# Patient Record
Sex: Female | Born: 1965 | Race: Black or African American | Hispanic: No | Marital: Single | Smoking: Never smoker
Health system: Southern US, Community
[De-identification: ages and names within clinical notes are randomized; demographics above are authoritative.]

## PROBLEM LIST (undated history)

## (undated) DIAGNOSIS — M25562 Pain in left knee: Secondary | ICD-10-CM

## (undated) DIAGNOSIS — E785 Hyperlipidemia, unspecified: Secondary | ICD-10-CM

## (undated) DIAGNOSIS — M25561 Pain in right knee: Secondary | ICD-10-CM

## (undated) DIAGNOSIS — G8929 Other chronic pain: Secondary | ICD-10-CM

## (undated) DIAGNOSIS — Z1371 Encounter for nonprocreative screening for genetic disease carrier status: Secondary | ICD-10-CM

## (undated) DIAGNOSIS — Z87898 Personal history of other specified conditions: Secondary | ICD-10-CM

## (undated) DIAGNOSIS — E559 Vitamin D deficiency, unspecified: Secondary | ICD-10-CM

## (undated) HISTORY — PX: LEEP: SHX91

## (undated) HISTORY — DX: Hyperlipidemia, unspecified: E78.5

## (undated) HISTORY — DX: Vitamin D deficiency, unspecified: E55.9

## (undated) HISTORY — PX: BREAST LUMPECTOMY: SHX2

## (undated) HISTORY — DX: Pain in left knee: M25.562

## (undated) HISTORY — DX: Other chronic pain: G89.29

## (undated) HISTORY — DX: Personal history of other specified conditions: Z87.898

## (undated) HISTORY — DX: Pain in right knee: M25.561

## (undated) HISTORY — DX: Encounter for nonprocreative screening for genetic disease carrier status: Z13.71

## (undated) HISTORY — PX: OTHER SURGICAL HISTORY: SHX169

---

## 1998-01-07 HISTORY — PX: BREAST EXCISIONAL BIOPSY: SUR124

## 2006-03-25 ENCOUNTER — Ambulatory Visit: Payer: Self-pay | Admitting: Family Medicine

## 2006-03-25 ENCOUNTER — Encounter: Payer: Self-pay | Admitting: Family Medicine

## 2007-05-13 ENCOUNTER — Ambulatory Visit: Payer: Self-pay | Admitting: Obstetrics & Gynecology

## 2007-05-13 ENCOUNTER — Encounter: Payer: Self-pay | Admitting: Obstetrics & Gynecology

## 2007-06-16 ENCOUNTER — Ambulatory Visit: Payer: Self-pay | Admitting: Obstetrics & Gynecology

## 2007-07-11 ENCOUNTER — Emergency Department (HOSPITAL_COMMUNITY): Admission: EM | Admit: 2007-07-11 | Discharge: 2007-07-11 | Payer: Self-pay | Admitting: Emergency Medicine

## 2008-02-03 ENCOUNTER — Ambulatory Visit: Payer: Self-pay | Admitting: Obstetrics and Gynecology

## 2008-02-03 ENCOUNTER — Encounter: Payer: Self-pay | Admitting: Obstetrics & Gynecology

## 2008-02-03 LAB — CONVERTED CEMR LAB
HCT: 36.2 % (ref 36.0–46.0)
Hemoglobin: 11.3 g/dL — ABNORMAL LOW (ref 12.0–15.0)
MCHC: 31.2 g/dL (ref 30.0–36.0)
MCV: 77.8 fL — ABNORMAL LOW (ref 78.0–100.0)
RBC: 4.65 M/uL (ref 3.87–5.11)
RDW: 17.4 % — ABNORMAL HIGH (ref 11.5–15.5)

## 2008-02-05 ENCOUNTER — Ambulatory Visit (HOSPITAL_COMMUNITY): Admission: RE | Admit: 2008-02-05 | Discharge: 2008-02-05 | Payer: Self-pay | Admitting: Obstetrics & Gynecology

## 2008-07-14 ENCOUNTER — Ambulatory Visit: Payer: Self-pay | Admitting: Obstetrics & Gynecology

## 2008-07-14 ENCOUNTER — Encounter: Payer: Self-pay | Admitting: Obstetrics & Gynecology

## 2008-07-19 ENCOUNTER — Encounter: Admission: RE | Admit: 2008-07-19 | Discharge: 2008-07-19 | Payer: Self-pay | Admitting: Obstetrics & Gynecology

## 2009-06-17 ENCOUNTER — Emergency Department: Payer: Self-pay | Admitting: Emergency Medicine

## 2009-07-13 ENCOUNTER — Ambulatory Visit: Payer: Self-pay | Admitting: Obstetrics and Gynecology

## 2010-05-22 NOTE — Assessment & Plan Note (Signed)
NAME:  PERNELL, DIKES NO.:  [id number]   MEDICAL RECORD NO.:  [id number]          PATIENT TYPE:  POB   LOCATION:  [facility] at [facility]         FACILITY:  [facility]   PHYSICIAN:  Argentina Donovan, MD        DATE OF BIRTH:  Dec 06, 1965   DATE OF SERVICE:  07/13/2009                                  CLINIC NOTE   The patient is a 45 year old single black female gravida 1, para 1-0-0-77  with a 7 year old daughter in for her annual exam with no physical or  medical complaints.  Review of systems is relatively negative.  The  patient is on Seasonique pills, but is defining very expensive since she  does not have insurance in very short time.  We are going to switch her  to Sprintec and give her enough so that she can take them in the same 3-  month cycle that she does with her present birth control pills.  Medical  history is only significant because of a borderline blood pressure  139/84.  She is slightly obese at 207, but 5 feet 8 inches tall.  She  had a history of abnormal Pap smears in the past, but her last several  have been normal.   ALLERGIES:  She is sensitive to LATEX.   FAMILY HISTORY:  Breast cancer on her father's side.  She had a  mammogram 1 year ago and is not due for another until next year.   SOCIAL HISTORY:  She does not smoke, drink alcohol or use illicit drugs.   PHYSICAL EXAMINATION:  GENERAL:  Well-developed, well-nourished African  American female in no acute distress.  VITAL SIGNS:  Blood pressure 139/84, pulse 57 per minute, weight 207  pounds, height 5 feet 8 inches tall.  HEENT:  Normocephalic, atraumatic within normal limits.  NECK:  Supple.  Thyroid, symmetrical.  No dominant masses.  BACK:  Erect.  LUNGS:  Clear to auscultation and percussion.  HEART:  No murmur.  Normal sinus rhythm.  ABDOMEN:  Soft, flat, nontender.  No masses or organomegaly.  BREASTS:  Symmetrical.  No dominant masses.  No nipple discharge.  No  supraclavicular or  axillary nodes.  PELVIC:  Genitalia, external genitalia is normal.  BUS within normal  limits.  Vagina is clean and well rugated.  Cervix is clean and parous.  Uterus is anterior, about [redacted] weeks gestational size, slightly irregular  and the adnexa is normal.  Cul-de-sac is free.  RECTAL:  She has a small asymptomatic external hemorrhoid.  EXTREMITIES:  No edema.  No varicosities.  NEUROLOGIC:  DTRs within normal limits.   IMPRESSION:  Normal physical examination.           ______________________________  Argentina Donovan, MD     PR/MEDQ  D:  07/13/2009  T:  07/14/2009  Job:  [id number]

## 2010-05-22 NOTE — Assessment & Plan Note (Signed)
NAMEMakinzy, [name]                ACCOUNT NO.:  [id number]   MEDICAL RECORD NO.:  [id number]          PATIENT TYPE:  POB   LOCATION:  [facility] at [facility]         FACILITY:  [facility]   PHYSICIAN:  Allie Bossier, MD        DATE OF BIRTH:  1965/10/02   DATE OF SERVICE:  07/14/2008                                  CLINIC NOTE   [name] is a 45 year old, single black, gravida 1, para 1, who has  a 59 year old daughter.  She comes in here for annual exam.  She has no  GYN complaints.  She wishes to restart her Seasonale birth control  pills.   PAST MEDICAL HISTORY:  She is overweight, borderline high cholesterol,  and fibroid uterus.  She says she had a history of an abnormal Pap  smear, but since she has been here since 2008 and 2009, she had normal  Pap smears.   ALLERGIES:  No known drug allergies.  She does say that she has itching  with LATEX glove use.   REVIEW OF SYSTEMS:  She is currently abstinent and has lost her job.  Mammogram is due.  Remainder of her review of systems questions are  negative.   FAMILY HISTORY:  He has a significant history for breast cancer.  Her  paternal grandmother, her paternal aunt, and 3 paternal first cousins  have breast cancer.  There is a strong family history of hypertension  and diabetes as well.  She does do her self-breast exam.   SOCIAL HISTORY:  Negative for tobacco, alcohol, or drug use.   PHYSICAL EXAMINATION:  VITAL SIGNS:  Weight 210 pounds, height 5 feet 8  inches, blood pressure 124/76, pulse 60.  HEENT:  Normal.  HEART:  Regular rate and rhythm.  LUNGS:  Clear to auscultation bilaterally.  BREASTS:  Normal bilaterally.  ABDOMEN:  Benign.  No hepatosplenomegaly.  EXTERNAL GENITALIA:  No vulvar lesions.  Cervix appears normal with  normal discharge.  Her uterus is approximately 14-week size.  It is  mobile and nontender.  Her adnexa are nontender without masses.   ASSESSMENT AND PLAN:  1. Annual exam.  I have recommended  self-breast and self-vulvar exams.      We will schedule a mammogram.  I have checked a Pap smear.  2. Menstrual regulations.  She wishes to continue the Seasonale as she      would like having short like.  Given a refill.      Allie Bossier, MD     MCD/MEDQ  D:  07/14/2008  T:  07/14/2008  Job:  [id number]

## 2010-05-22 NOTE — Assessment & Plan Note (Signed)
NAMELakresha, Hardy ANN                ACCOUNT NO.:  [id number]   MEDICAL RECORD NO.:  [id number]          PATIENT TYPE:  POB   LOCATION:  [facility] at [facility]         FACILITY:  [facility]   PHYSICIAN:  Allie Bossier, MD        DATE OF BIRTH:  [birth year]   DATE OF SERVICE:                                  CLINIC NOTE   HISTORY OF PRESENT ILLNESS:  [name] is a 45 year old single white  gravida 1, para 64 with a 44 year old daughter.  She comes here for her  annual exam.  She has no GYN complaints.  She does wish to start birth  control pills.  She has been in a monogamous relationship for the last 5  months and has used condoms for the most part, but wishes a more secure  form of birth control.  Her last period was 05/05/2007.   PAST MEDICAL HISTORY:  She is overweight.  She has a history of  borderline high cholesterol, but she has not had it checked in several  years.  She gives a history of having had an abnormal Pap smear, but we  do not know exactly what type.  No treatment was required.   PAST SURGICAL HISTORY:  She had a breast biopsy and C-section.   ALLERGIES:  NO KNOWN DRUG ALLERGIES.  SHE DOES SAY THAT SHE HAS ITCHING  WITH LATEX GLOVE USE.   REVIEW OF SYSTEMS:  She is monogamous for the last 5 months.  She works  at [employer]. Mammogram was done last in 2000, and we will have  one scheduled soon.  Otherwise, negative.   FAMILY HISTORY:  Positive for breast cancer in her paternal grandmother,  paternal aunt, paternal first cousins.  There is a strong family history  of hypertension and diabetes in her family as well.  She does do self-  breast exam.   SOCIAL HISTORY:  Denies tobacco, alcohol or drug use.   PHYSICAL EXAMINATION:  VITAL SIGNS:  Weight 206 (please note this is a  20 pound decrease since last year), blood pressure 114/62, pulse 71.  HEENT:  Normal.  HEART:  Regular rate and rhythm.  BREASTS:  Normal bilaterally.  LUNGS:  Clear to auscultation  bilaterally.  ABDOMEN:  No hepatosplenomegaly appreciated.  No other abnormalities.  PELVIC:  External genitalia, no lesions.  Her cervix is displaced to her  right, appears normal.  No lesions with normal discharge.  Bimanual exam  reveals a mobile anteverted uterus with approximately a 4 cm fibroid on  the left of the fundus.  The adnexa are without masses and are without  tenderness.   ASSESSMENT/PLAN:  1. Annual exam.  Recommended continue self-breast exams.  Discussed      self vulvar exams monthly.  Recommended a multivitamin as well.  2. Birth control.  We will start Seasonique.  I have given her a 3-      month pack and a prescription.  She will come back for fasting      lipids and blood pressure check along with fasting sugar in the      near future.  Will schedule a mammogram.  She does say that in the      future she wishes to be married and have another child.  We have      discussed decreasing fertility as age goes on, and she understands      this.      Allie Bossier, MD     MCD/MEDQ  D:  05/13/2007  T:  05/13/2007  Job:  [id number]

## 2010-05-25 NOTE — Assessment & Plan Note (Signed)
NAMEQuanesha, [name]                ACCOUNT NO.:  [id number]   MEDICAL RECORD NO.:  [id number]          PATIENT TYPE:  POB   LOCATION:  [facility] at [facility]         FACILITY:  [facility]   PHYSICIAN:  Tinnie Gens, MD        DATE OF BIRTH:  [birth year]   DATE OF SERVICE:                                  CLINIC NOTE   CHIEF COMPLAINT:  Yearly exam.   HISTORY OF PRESENT ILLNESS:  The patient is a 45 year old gravida 1,  para 1, who comes in today for yearly exam.  Her last Pap, she reports,  some __________ were abnormal.  She was supposed to return for  colposcopy at __________ but she never did.  She is otherwise without  complaints.   PAST MEDICAL HISTORY:  Significant for once having borderline elevated  cholesterol which has been normal since then.   PAST SURGICAL HISTORY:  She has had a C-section and removal of a breast  mass.   MEDICATIONS:  None.   ALLERGIES:  None known.   OB HISTORY:  G 1, P1, one C-section from her last for post dates.   GYN HISTORY:  Menarche at age 70, cycles are regular every 28 days, last  approximately 7 days and have heavy flow but is unchanged for her.  Her  LMP is March 16, 2006.  She is on no birth control but she is not  currently sexually active.  She does have a history of an abnormal Pap  in 2004 and a breast mass that was removed that was thought to be  related to infection, related to mastitis.   FAMILY HISTORY:  Significant diabetes, hypertension, and cancer in a  paternal grandmother.   SOCIAL HISTORY:  No tobacco, alcohol or drug use.  She works as an  Airline pilot.   REVIEW OF SYSTEMS:  Fourteen point review of systems reviewed as is  positive for ringing in her ears.  She has had a very extensive workup  of this at [facility] which did not reveal the cause.  This is her only  positive review of systems.  Please see GYN history in the chart.   PHYSICAL EXAMINATION:  VITAL SIGNS:  Blood pressure is 108/60, weight is  226, pulse 54,  height 5 feet 8 inches.  GENERAL:  She is a well developed, well nourished female in no acute  distress.  HEENT:  Normocephalic, atraumatic.  Sclerae anicteric.  Dentition is in  good repair.  NECK:  Supple.  Normal thyroid.  LUNGS:  Clear bilaterally.  CARDIOVASCULAR:  Regular rate and rhythm, no murmur, gallop or rubs.  ABDOMEN:  Soft, nontender, nondistended no masses.  EXTREMITIES:  No cyanosis, clubbing, trace edema.  BREASTS:  Symmetric with everted nipples.  No masses, no supraclavicular  or axillary adenopathy.  GU:  Normal external female genitalia.  BUS normal.  Vagina is pink and  rugate.  The cervix is nulliparous without lesion.  The uterus is  presently 8 to 10 weeks size, firm and retroverted.  No adnexal mass or  tenderness.   IMPRESSION:  1. Yearly exam.  2. History of abnormal Pap.   PLAN:  1. Pap  smear today.  2. We need to schedule a mammogram for the patient in May 2008, she      will turn 41.  3. We will follow-up her Pap smear as it comes back.           ______________________________  Tinnie Gens, MD     TP/MEDQ  D:  03/25/2006  T:  03/26/2006  Job:  [id number]

## 2010-08-08 ENCOUNTER — Encounter: Payer: Self-pay | Admitting: Gynecology

## 2010-08-09 ENCOUNTER — Ambulatory Visit (INDEPENDENT_AMBULATORY_CARE_PROVIDER_SITE_OTHER): Payer: BC Managed Care – PPO | Admitting: Obstetrics & Gynecology

## 2010-08-09 ENCOUNTER — Encounter: Payer: Self-pay | Admitting: Obstetrics & Gynecology

## 2010-08-09 VITALS — BP 106/66 | HR 71 | Ht 68.0 in | Wt 228.0 lb

## 2010-08-09 DIAGNOSIS — Z1272 Encounter for screening for malignant neoplasm of vagina: Secondary | ICD-10-CM

## 2010-08-09 DIAGNOSIS — R87613 High grade squamous intraepithelial lesion on cytologic smear of cervix (HGSIL): Secondary | ICD-10-CM | POA: Insufficient documentation

## 2010-08-09 DIAGNOSIS — Z01419 Encounter for gynecological examination (general) (routine) without abnormal findings: Secondary | ICD-10-CM

## 2010-08-09 MED ORDER — LEVONORGEST-ETH ESTRAD 91-DAY 0.15-0.03 &0.01 MG PO TABS: 1.0000 | ORAL_TABLET | Freq: Every day | ORAL | Status: DC

## 2010-08-09 NOTE — Progress Notes (Signed)
  Subjective:    Mayli Covington is a 45 y.o. female here for a routine exam.  No current complaints:   Gynecologic History Patient's last menstrual period was 07/26/2010. Contraception: OCP (estrogen/progesterone) Last Pap: 07/13/09. Results were: normal Last mammogram: 07/2009. Results were: normal  Obstetric History OB History    Grav Para Term Preterm Abortions TAB SAB Ect Mult Living   1 1        1      # Outc Date GA Lbr Len/2nd Wgt Sex Del Anes PTL Lv   1 PAR     F CCS   Yes       The following portions of the patient's history were reviewed and updated as appropriate: allergies, current medications, past family history, past medical history, past social history, past surgical history and problem list.  Review of Systems A comprehensive review of systems was negative.    Objective:   Filed Vitals:   08/09/10 0836  BP: 106/66  Pulse: 71    GENERAL: Well-developed, well-nourished female in no acute distress.  HEENT: Normocephalic, atraumatic. Sclerae anicteric.  NECK: Supple. Normal thyroid.  LUNGS: Clear to auscultation bilaterally.  HEART: Regular rate and rhythm. BREASTS: Symmetric with everted nipples. No masses, skin changes, nipple drainage,   lymphadenopathy. ABDOMEN: Soft, nontender, nondistended. No organomegaly. PELVIC: Normal external female genitalia. Vagina is pink and rugated.  Normal discharge. Normal cervix contour. Uterus is normal in size. No adnexal mass or tenderness. Pap smear obtained.  EXTREMITIES: No cyanosis, clubbing, or edema, 2+ distal pulses.     Assessment:    Healthy female exam.     Plan:  Refill of Seasonique ordered  Contraception: OCP (estrogen/progesterone). Mammogram ordered. Follow up in: 1 year.    ANYANWU,UGONNA A 08/09/2010

## 2010-10-04 ENCOUNTER — Ambulatory Visit (INDEPENDENT_AMBULATORY_CARE_PROVIDER_SITE_OTHER): Payer: BC Managed Care – PPO | Admitting: Obstetrics & Gynecology

## 2010-10-04 ENCOUNTER — Encounter: Payer: Self-pay | Admitting: Obstetrics & Gynecology

## 2010-10-04 VITALS — BP 137/83 | HR 65 | Ht 68.0 in | Wt 229.0 lb

## 2010-10-04 DIAGNOSIS — R87613 High grade squamous intraepithelial lesion on cytologic smear of cervix (HGSIL): Secondary | ICD-10-CM

## 2010-10-04 DIAGNOSIS — IMO0002 Reserved for concepts with insufficient information to code with codable children: Secondary | ICD-10-CM

## 2010-10-04 DIAGNOSIS — Z1239 Encounter for other screening for malignant neoplasm of breast: Secondary | ICD-10-CM

## 2010-10-04 DIAGNOSIS — Z1231 Encounter for screening mammogram for malignant neoplasm of breast: Secondary | ICD-10-CM

## 2010-10-04 NOTE — Patient Instructions (Signed)
Care After Colposcopy Colposcopy is a procedure in which a special tool is used to magnify the surface of the cervix. A tissue sample (biopsy) may also be taken. This sample will be used to detect cancer of the cervix or other problems. These procedures may be done after a pelvic exam shows a possible problem.  AFTER THE TEST  You may have some cramping.   Lie down for a few minutes if you feel light headed.   You may have some bleeding which should stop in a few days.  HOME CARE  Do not have sex or use tampons for 2-3 days or as directed.   Only take medicine as directed by your doctor.   If you are on birth control pills, continue to take your pills the way you normally do.   To protect your privacy, test results cannot be given over the phone. Make sure you get the results of your test. Ask how you will get the results if you have not been told. Do not assume everything is normal if you have not heard from your doctor. Follow up on all of your test results.  GET HELP RIGHT AWAY IF YOU:  Are bleeding heavily or passing blood clots.   Develop a fever of 102F (38.9C) or higher.   Have abnormal vaginal discharge.   Have cramps that do not go away after medicine.   Feel light headed, dizzy or faint.  MAKE SURE YOU:   Understand these instructions.   Will watch your condition.   Will get help right away if you are not doing well or get worse.  Document Released: 06/12/2007 Document Re-Released: 10/21/2008 [facility] Patient Information 2011 [place], [place].    Cervical Dysplasia Cervical dysplasia is a condition in which a woman has abnormal changes in the cells of her cervix. The cervix is the opening to the uterus (womb) between the vagina and the uterus. These changes are called cervical dysplasia and may be the first signs of cervical cancer. These cells can be taken from the cervix during a Pap smear and then looked at under a microscope. With early detection, treatment,  and close follow-up care, nearly all cervical dysplasia can be cured. If untreated, the mild to moderate stages of dysplasia often grow more severe.  CAUSES Risk factors for cervical dysplasia.  Having had a sexually transmitted disease, including:   Chlamydia.   Human papilloma virus (HPV).  The following increase the risk for cervical dysplasia.  Becoming sexually active before age 47.   Having had more than 1 sexual partner.   Not using protection, such as condoms, during sexual intercourse, especially with new sexual partners.   Having had cancer of the vagina or vulva.   Having a sexual partner whose previous partner had cancer of the cervix or cervical dysplasia.   Having a sexual partner who has or has had cancer of the penis.   Having a weakened immune system (HIV, organ transplant).   Being the daughter of a woman who took DES (diethylstilbestrol) during pregnancy.   A history of cervical cancer in a woman's sister or mother.   Smoking.   Having had an abnormal pap test in the past.  SYMPTOMS  There are usually no symptoms. If there are symptoms, they may be vague such as:  Abnormal vaginal discharge.   Bleeding between periods or following intercourse.   Bleeding during menopause.   Pain on intercourse (dyspareunia).  DIAGNOSIS  The Pap test is the best  way of detecting abnormalities of the cervix.   Biopsy (removing a piece of tissue to look at under the microscope) of the cervix when the Pap test is abnormal or when the Pap test is normal, but the cervix looks abnormal.  TREATMENT Catching and treating the changes early with pap smears can prevent cervical cancer.  Cryotherapy freezes the abnormal cells with a steel tip instrument.   A laser can be used to remove the abnormal cells.   Loop electrocautery excision procedure (LEEP). This procedure uses a heated electrical loop to remove a cone-like portion of the cervix, including the cervical canal.     For more serious cases of cervical dysplasia, the abnormal tissue may be removed surgically by:   A cone biopsy (by cold knife, laser or LEEP). A procedure in which a portion of the center of the cervix with the cervical canal is removed.   The uterus and cervix are removed (hysterectomy).  Your caregiver will advise you regarding the need and timing of pap smears in your follow-up. Women who have been treated for dysplasia should be closely followed with pelvic exams and pap smears. During the first year following treatment of cervical dysplasia, pap smears should be done every 3 to 4 months. In the second year, the schedule is every 6 months, or as recommended by your caregiver. See your caregiver for new or worsening problems. HOME CARE INSTRUCTIONS  Follow the instructions and recommendations of your caregiver regarding medications and follow-up appointments.   Only take over-the-counter or prescription medications for pain or discomfort as directed by your caregiver.   Cramping and pelvic discomfort may follow cryotherapy. It is not abnormal to have watery discharge for several weeks after.   Laser, cone surgery, cryotherapy or LEEP can cause a bad smelling vaginal discharge. It may also cause vaginal bleeding for a couple weeks following the procedure. The discharge may be black from the paste used to control bleeding from the cone site. This is normal.   Do not use tampons, have sexual intercourse or douche until your caregiver says it is okay.  SEEK MEDICAL CARE IF:  You develop genital warts.   You need a prescription for pain medication following your treatment.  SEEK IMMEDIATE MEDICAL CARE IF:  Bleeding is heavier than a normal menstrual period.   If you develop bright red bleeding, especially if you have blood clots.   You develop and unexplained oral temperature above 101 or more.   You have increasing cramps or pain not relieved with medication.   You are  lightheaded, unusually weak or have fainting spells.   You have abnormal vaginal discharge.   You develop abdominal pain.  PREVENTION  The surest way to prevent cervical dysplasia is to abstain from sexual intercourse.   Practice safe sex, use condoms and have only one sex partner who does not have other sex partners.   A PAP smear is done to screen for cervical cancer.   The first PAP smear should be done at age 45.   Between ages 74 and 74, PAP smears are repeated every 2 years.   Beginning at age 64, you are advised to have a PAP smear every 3 years as long as your past 3 PAP smears have been normal.   Some women have medical problems that increase the chance of getting cervical cancer. Talk to your caregiver about these problems. It is especially important to talk to your caregiver if a new problem develops soon after  your last PAP smear. In these cases, your caregiver may recommend more frequent screening and Pap smears.   The above recommendations are the same for women who have or have not gotten the vaccine for HPV (Human Papilloma virus).   If you had a hysterectomy for a problem that was not a cancer or a condition that could lead to cancer, then you no longer need Pap smears.   If you are between ages 58 and 61, and you have had normal Pap smears going back 10 years, you no longer need Pap smears.   If you have had past treatment for cervical cancer or a condition that could lead to cancer, you need Pap smears and screening for cancer for at least 20 years after your treatment.   Your caregiver may do additional tests including:   Colposcopy. A procedure in which a special microscope magnifies the cells and allows the provider to closely examine the cervix, vagina, and vulva.   Biopsy. A small tissue sample is taken from the cervix, vagina or vulva. This is generally done in your caregivers office.   A cone biopsy (cold knife or laser). A large tissue sample is taken  from the cervix. This procedure is usually done in an operating room under a general anesthetic. The cone often removes all abnormal tissue and so may also complete the treatment.   LEEP, also removing a circular portion of the cervix and is done in a doctors office under a local anesthetic.  Now there is a vaccine, Gardasil, that was developed to prevent the HPV'S that can cause cancer of the cervix and genital warts. It is recommended for females ages 67 to 3. It should not be given to pregnant women until more is known about its effects on the fetus. Not all cancers of the cervix are caused by the HPV. Routine gynecology exams and Pap tests should continue as recommended by your caregiver.  Document Released: 12/24/2004 Document Re-Released: 03/22/2008 [facility] Patient Information 2011 [place], [place].

## 2010-10-04 NOTE — Progress Notes (Signed)
  Colposcopy Procedure Note  Indications: Pap smear 1 months ago showed: high-grade squamous intraepithelial neoplasia  (HGSIL-encompassing moderate and severe dysplasia). The prior pap showed no abnormalities.  Prior cervical/vaginal disease: normal exam without visible pathology and she reports having abnormal paps many years ago, but normal subsequent evaluation.   Procedure Details  The risks and benefits of the procedure and Written informed consent obtained.  Speculum placed in vagina and excellent visualization of cervix achieved, cervix swabbed x 3 with acetic acid solution.  Findings: Cervix: acetowhite lesion(s) noted at 5 o'clock and punctation noted at 5 o'clock; SCJ visualized 360 degrees without lesions, SCJ visualized - lesion at 5 o'clock, endocervical curettage performed, cervical biopsies taken at 5 o'clock, specimen labelled and sent to pathology and hemostasis achieved with Monsel's solution. Vaginal inspection: vaginal colposcopy not performed. Vulvar colposcopy: vulvar colposcopy not performed.  Specimens: Cervical biopsy at 5 o'clock; ECC  Complications: none.  Plan: Specimens labelled and sent to Pathology. Will base further treatment on Pathology findings. Treatment options discussed with patient. Post biopsy instructions given to patient. Return to discuss Pathology results in 2 weeks. Screening aammogram for patient to be scheduled at the [facility]; has extensive family history of breast cancer and patient had a benign breast mass excision a couple of years ago.  Will follow up results.

## 2010-10-29 ENCOUNTER — Encounter: Payer: Self-pay | Admitting: Obstetrics & Gynecology

## 2010-10-29 ENCOUNTER — Ambulatory Visit (INDEPENDENT_AMBULATORY_CARE_PROVIDER_SITE_OTHER): Payer: BC Managed Care – PPO | Admitting: Obstetrics & Gynecology

## 2010-10-29 VITALS — BP 114/69 | HR 63 | Ht 68.0 in | Wt 222.0 lb

## 2010-10-29 DIAGNOSIS — R87613 High grade squamous intraepithelial lesion on cytologic smear of cervix (HGSIL): Secondary | ICD-10-CM

## 2010-10-29 DIAGNOSIS — N870 Mild cervical dysplasia: Secondary | ICD-10-CM

## 2010-10-29 DIAGNOSIS — D069 Carcinoma in situ of cervix, unspecified: Secondary | ICD-10-CM | POA: Insufficient documentation

## 2010-10-29 DIAGNOSIS — IMO0002 Reserved for concepts with insufficient information to code with codable children: Secondary | ICD-10-CM

## 2010-10-29 NOTE — Progress Notes (Signed)
Patient here to folllow up results.  Had HGSIL pap in  08/09/10; colposcopy was done on 10/04/10  Diagnosis 1. 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 2. Endocervix, curettage BENIGN ENDOCERVICAL MUCOSA. Microscopic Comment 1. There is no evidence of high grade squamous intraepithelial lesion in the specimen submitted  Results discussed with patient.  She does not have any other recent abnormal paps in the last over 20 years.  She was given two options (see [facility] algorithm below): 1) Perform LEEP to get a bigger biopsy to rule out any possible missed high grade anomaly in the setting of a HGSIL pap (diagnostic excisional procedure) 2) Repeat pap smears every six months until she has two consecutive normal pap smears, then she can resume annual screening. If any pap smear is abnormal, she will need repeat colposcopy and appropriate evaluation. If she has repeat HGSIL pap, LEEP will be recommended.    Patient opted for the second option.  She will be scheduled for repeat pap smear in 02/2011. Patient will also schedule a screening mammogram soon. She can return to clinic as needed or for scheduled appointments.

## 2010-10-29 NOTE — Patient Instructions (Signed)
Cervical Dysplasia Cervical dysplasia is a condition in which a woman has abnormal changes in the cells of her cervix. The cervix is the opening to the uterus (womb) between the vagina and the uterus. These changes are called cervical dysplasia and may be the first signs of cervical cancer. These cells can be taken from the cervix during a Pap test and then looked at under a microscope. With early detection, treatment, and close follow-up care, nearly all cervical dysplasia can be cured. If untreated, the mild to moderate stages of dysplasia often grow more severe.  RISK FACTORS  The following increase the risk for cervical dysplasia.  Having had a sexually transmitted disease, including:   Chlamydia.   Human papilloma virus (HPV).   Becoming sexually active before age 48.   Having had more than 1 sexual partner.   Not using protection, such as condoms, during sexual intercourse, especially with new sexual partners.   Having had cancer of the vagina or vulva.   Having a sexual partner whose previous partner had cancer of the cervix or cervical dysplasia.   Having a sexual partner who has or has had cancer of the penis.   Having a weakened immune system (HIV, organ transplant).   Being the daughter of a woman who took DES (diethylstilbestrol) during pregnancy.   A history of cervical cancer in a woman's sister or mother.   Smoking.   Having had an abnormal Pap test in the past.  SYMPTOMS  There are usually no symptoms. If there are symptoms, they may be vague such as:  Abnormal vaginal discharge.   Bleeding between periods or following intercourse.   Bleeding during menopause.   Pain on intercourse (dyspareunia).  DIAGNOSIS   The Pap test is the best way of detecting abnormalities of the cervix.   Biopsy (removing a piece of tissue to look at under the microscope) of the cervix when the Pap test is abnormal or when the Pap test is normal, but the cervix looks abnormal.    TREATMENT  Catching and treating the changes early with Pap tests can prevent cervical cancer.  Cryotherapy freezes the abnormal cells with a steel tip instrument.   A laser can be used to remove the abnormal cells.   Loop electrocautery excision procedure (LEEP). This procedure uses a heated electrical loop to remove a cone-like portion of the cervix, including the cervical canal.   For more serious cases of cervical dysplasia, the abnormal tissue may be removed surgically by:   A cone biopsy (by cold knife, laser or LEEP). A procedure in which a portion of the center of the cervix with the cervical canal is removed.   The uterus and cervix are removed (hysterectomy).  Your caregiver will advise you regarding the need and timing of Pap tests in your follow-up. Women who have been treated for dysplasia should be closely followed with pelvic exams and Pap tests. During the first year following treatment of cervical dysplasia, Pap tests should be done every 6 months, or as recommended by your caregiver. See your caregiver for new or worsening problems. HOME CARE INSTRUCTIONS   Follow the instructions and recommendations of your caregiver regarding medicines and follow-up appointments.   Only take over-the-counter or prescription medicines for pain or discomfort as directed by your caregiver.   Cramping and pelvic discomfort may follow cryotherapy. It is not abnormal to have watery discharge for several weeks after.   Laser, cone surgery, cryotherapy or LEEP can cause a bad  smelling vaginal discharge. It may also cause vaginal bleeding for a couple weeks following the procedure. The discharge may be black from the paste used to control bleeding from the cone site. This is normal.   Do not use tampons, have sexual intercourse or douche until your caregiver says it is okay.  SEEK MEDICAL CARE IF:   You develop genital warts.   You need a prescription for pain medicine following your  treatment.  SEEK IMMEDIATE MEDICAL CARE IF:   Your bleeding is heavier than a normal menstrual period.   You develop bright red bleeding, especially if you have blood clots.   You have a fever.   You have increasing cramps or pain not relieved with medicine.   You are lightheaded, unusually weak, or have fainting spells.   You have abnormal vaginal discharge.   You develop abdominal pain.  PREVENTION   The surest way to prevent cervical dysplasia is to abstain from sexual intercourse.   Practice safe sex, use condoms and have only one sex partner who does not have other sex partners.   A Pap test is done to screen for cervical cancer.   The first Pap test should be done at age 6.   Between ages 79 and 80, Pap tests are repeated every 2 years.   Beginning at age 21, you are advised to have a Pap test every 3 years as long as your past 3 Pap tests have been normal.   Some women have medical problems that increase the chance of getting cervical cancer. Talk to your caregiver about these problems. It is especially important to talk to your caregiver if a new problem develops soon after your last Pap test. In these cases, your caregiver may recommend more frequent screening and Pap tests.   The above recommendations are the same for women who have or have not gotten the vaccine for HPV (Human Papillomavirus).   If you had a hysterectomy for a problem that was not a cancer or a condition that could lead to cancer, then you no longer need Pap tests. However, even if you no longer need a Pap test, a regular exam is a good idea to make sure no other problems are starting.    If you are between ages 19 and 45, and you have had normal Pap tests going back 10 years, you no longer need Pap tests. However, even if you no longer need a Pap test, a regular exam is a good idea to make sure no other problems are starting.    If you have had past treatment for cervical cancer or a condition  that could lead to cancer, you need Pap tests and screening for cancer for at least 20 years after your treatment.   If Pap tests have been discontinued, risk factors (such as a new sexual partner) need to be re-assessed to determine if screening should be resumed.   Some women may need screenings more often if they are at high risk for cervical cancer.   Your caregiver may do additional tests including:   Colposcopy. A procedure in which a special microscope magnifies the cells and allows the provider to closely examine the cervix, vagina, and vulva.   Biopsy. A small tissue sample is taken from the cervix, vagina or vulva. This is generally done in your caregivers office.   A cone biopsy (cold knife or laser). A large tissue sample is taken from the cervix. This procedure is usually done in  an operating room under a general anesthetic. The cone often removes all abnormal tissue and so may also complete the treatment.   LEEP, also removing a circular portion of the cervix and is done in a doctors office under a local anesthetic.   Now there is a vaccine, Gardasil, that was developed to prevent the HPV'S that can cause cancer of the cervix and genital warts. It is recommended for females ages 46 to 48. It should not be given to pregnant women until more is known about its effects on the fetus. Not all cancers of the cervix are caused by the HPV. Routine gynecology exams and Pap tests should continue as recommended by your caregiver.  Document Released: 12/24/2004 Document Revised: 09/05/2010 Document Reviewed: 12/16/2007 [facility] Patient Information 2012 [place], [place].

## 2010-11-22 ENCOUNTER — Ambulatory Visit
Admission: RE | Admit: 2010-11-22 | Discharge: 2010-11-22 | Disposition: A | Discharge status: Home / Self Care | Payer: BC Managed Care – PPO | Source: Ambulatory Visit | Attending: Obstetrics & Gynecology | Admitting: Obstetrics & Gynecology

## 2010-11-22 DIAGNOSIS — Z1231 Encounter for screening mammogram for malignant neoplasm of breast: Secondary | ICD-10-CM

## 2011-08-13 ENCOUNTER — Ambulatory Visit (INDEPENDENT_AMBULATORY_CARE_PROVIDER_SITE_OTHER): Payer: BC Managed Care – PPO | Admitting: Obstetrics and Gynecology

## 2011-08-13 ENCOUNTER — Encounter: Payer: Self-pay | Admitting: Obstetrics and Gynecology

## 2011-08-13 VITALS — BP 118/80 | Ht 68.0 in | Wt 226.0 lb

## 2011-08-13 DIAGNOSIS — R87613 High grade squamous intraepithelial lesion on cytologic smear of cervix (HGSIL): Secondary | ICD-10-CM

## 2011-08-13 DIAGNOSIS — Z01419 Encounter for gynecological examination (general) (routine) without abnormal findings: Secondary | ICD-10-CM

## 2011-08-13 MED ORDER — LEVONORGEST-ETH ESTRAD 91-DAY 0.15-0.03 &0.01 MG PO TABS: 1.0000 | ORAL_TABLET | Freq: Every day | ORAL | Status: DC

## 2011-08-13 NOTE — Progress Notes (Signed)
  Subjective:     [name] is a 46 y.o. female with BMI 34 presenting today for a comprehensive physical exam. The patient reports no problems. Patient using Seasonique for birth control and is satisfied with that method.  History   Social History  . Marital Status: Single    Spouse Name: N/A    Number of Children: N/A  . Years of Education: N/A   Occupational History  . Not on file.   Social History Main Topics  . Smoking status: Never Smoker   . Smokeless tobacco: Never Used  . Alcohol Use: No  . Drug Use: No  . Sexually Active: No   Other Topics Concern  . Not on file   Social History Narrative  . No narrative on file   Health Maintenance  Topic Date Due  . Tetanus/tdap  05/28/1985  . Influenza Vaccine  10/08/2011  . Pap Smear  08/08/2013       Review of Systems A comprehensive review of systems was negative.   Objective:      GENERAL: Well-developed, well-nourished female in no acute distress.  HEENT: Normocephalic, atraumatic. Sclerae anicteric.  NECK: Supple. Normal thyroid.  LUNGS: Clear to auscultation bilaterally.  HEART: Regular rate and rhythm. BREASTS: Symmetric in size. No palpable masses or lymphadenopathy, skin changes, or nipple drainage. ABDOMEN: Soft, nontender, nondistended. No organomegaly. PELVIC: Normal external female genitalia. Vagina is pink and rugated.  Normal discharge. Normal appearing cervix. Uterus is normal in size. No adnexal mass or tenderness. EXTREMITIES: No cyanosis, clubbing, or edema, 2+ distal pulses.    Assessment:    Healthy female exam.      Plan:    Pap smear performed. Patient with previous HGSIL on pap and failed to follow-up q 6 months for repeat pap. RTC in 6 months for repeat pap if today's pap smear is normal Patient advised to continue self breast and vulva monthly exam Referral for mammogram provided Rx Sasonique provided See After Visit Summary for Counseling Recommendations

## 2011-08-13 NOTE — Patient Instructions (Signed)
Preventive Care for Adults, Female A healthy lifestyle and preventive care can promote health and wellness. Preventive health guidelines for women include the following key practices.  A routine yearly physical is a good way to check with your caregiver about your health and preventive screening. It is a chance to share any concerns and updates on your health, and to receive a thorough exam.   Visit your dentist for a routine exam and preventive care every 6 months. Brush your teeth twice a day and floss once a day. Good oral hygiene prevents tooth decay and gum disease.   The frequency of eye exams is based on your age, health, family medical history, use of contact lenses, and other factors. Follow your caregiver's recommendations for frequency of eye exams.   Eat a healthy diet. Foods like vegetables, fruits, whole grains, low-fat dairy products, and lean protein foods contain the nutrients you need without too many calories. Decrease your intake of foods high in solid fats, added sugars, and salt. Eat the right amount of calories for you.Get information about a proper diet from your caregiver, if necessary.   Regular physical exercise is one of the most important things you can do for your health. Most adults should get at least 150 minutes of moderate-intensity exercise (any activity that increases your heart rate and causes you to sweat) each week. In addition, most adults need muscle-strengthening exercises on 2 or more days a week.   Maintain a healthy weight. The body mass index (BMI) is a screening tool to identify possible weight problems. It provides an estimate of body fat based on height and weight. Your caregiver can help determine your BMI, and can help you achieve or maintain a healthy weight.For adults 20 years and older:   A BMI below 18.5 is considered underweight.   A BMI of 18.5 to 24.9 is normal.   A BMI of 25 to 29.9 is considered overweight.   A BMI of 30 and above  is considered obese.   Maintain normal blood lipids and cholesterol levels by exercising and minimizing your intake of saturated fat. Eat a balanced diet with plenty of fruit and vegetables. Blood tests for lipids and cholesterol should begin at age 20 and be repeated every 5 years. If your lipid or cholesterol levels are high, you are over 50, or you are at high risk for heart disease, you may need your cholesterol levels checked more frequently.Ongoing high lipid and cholesterol levels should be treated with medicines if diet and exercise are not effective.   If you smoke, find out from your caregiver how to quit. If you do not use tobacco, do not start.   If you are pregnant, do not drink alcohol. If you are breastfeeding, be very cautious about drinking alcohol. If you are not pregnant and choose to drink alcohol, do not exceed 1 drink per day. One drink is considered to be 12 ounces (355 mL) of beer, 5 ounces (148 mL) of wine, or 1.5 ounces (44 mL) of liquor.   Avoid use of street drugs. Do not share needles with anyone. Ask for help if you need support or instructions about stopping the use of drugs.   High blood pressure causes heart disease and increases the risk of stroke. Your blood pressure should be checked at least every 1 to 2 years. Ongoing high blood pressure should be treated with medicines if weight loss and exercise are not effective.   If you are 55 to 45   years old, ask your caregiver if you should take aspirin to prevent strokes.   Diabetes screening involves taking a blood sample to check your fasting blood sugar level. This should be done once every 3 years, after age 45, if you are within normal weight and without risk factors for diabetes. Testing should be considered at a younger age or be carried out more frequently if you are overweight and have at least 1 risk factor for diabetes.   Breast cancer screening is essential preventive care for women. You should practice  "breast self-awareness." This means understanding the normal appearance and feel of your breasts and may include breast self-examination. Any changes detected, no matter how small, should be reported to a caregiver. Women in their 20s and 30s should have a clinical breast exam (CBE) by a caregiver as part of a regular health exam every 1 to 3 years. After age 40, women should have a CBE every year. Starting at age 40, women should consider having a mammography (breast X-ray test) every year. Women who have a family history of breast cancer should talk to their caregiver about genetic screening. Women at a high risk of breast cancer should talk to their caregivers about having magnetic resonance imaging (MRI) and a mammography every year.   The Pap test is a screening test for cervical cancer. A Pap test can show cell changes on the cervix that might become cervical cancer if left untreated. A Pap test is a procedure in which cells are obtained and examined from the lower end of the uterus (cervix).   Women should have a Pap test starting at age 21.   Between ages 21 and 29, Pap tests should be repeated every 2 years.   Beginning at age 30, you should have a Pap test every 3 years as long as the past 3 Pap tests have been normal.   Some women have medical problems that increase the chance of getting cervical cancer. Talk to your caregiver about these problems. It is especially important to talk to your caregiver if a new problem develops soon after your last Pap test. In these cases, your caregiver may recommend more frequent screening and Pap tests.   The above recommendations are the same for women who have or have not gotten the vaccine for human papillomavirus (HPV).   If you had a hysterectomy for a problem that was not cancer or a condition that could lead to cancer, then you no longer need Pap tests. Even if you no longer need a Pap test, a regular exam is a good idea to make sure no other  problems are starting.   If you are between ages 65 and 70, and you have had normal Pap tests going back 10 years, you no longer need Pap tests. Even if you no longer need a Pap test, a regular exam is a good idea to make sure no other problems are starting.   If you have had past treatment for cervical cancer or a condition that could lead to cancer, you need Pap tests and screening for cancer for at least 20 years after your treatment.   If Pap tests have been discontinued, risk factors (such as a new sexual partner) need to be reassessed to determine if screening should be resumed.   The HPV test is an additional test that may be used for cervical cancer screening. The HPV test looks for the virus that can cause the cell changes on the cervix.   The cells collected during the Pap test can be tested for HPV. The HPV test could be used to screen women aged 30 years and older, and should be used in women of any age who have unclear Pap test results. After the age of 30, women should have HPV testing at the same frequency as a Pap test.   Colorectal cancer can be detected and often prevented. Most routine colorectal cancer screening begins at the age of 50 and continues through age 75. However, your caregiver may recommend screening at an earlier age if you have risk factors for colon cancer. On a yearly basis, your caregiver may provide home test kits to check for hidden blood in the stool. Use of a small camera at the end of a tube, to directly examine the colon (sigmoidoscopy or colonoscopy), can detect the earliest forms of colorectal cancer. Talk to your caregiver about this at age 50, when routine screening begins. Direct examination of the colon should be repeated every 5 to 10 years through age 75, unless early forms of pre-cancerous polyps or small growths are found.   Hepatitis C blood testing is recommended for all people born from 1945 through 1965 and any individual with known risks for  hepatitis C.   Practice safe sex. Use condoms and avoid high-risk sexual practices to reduce the spread of sexually transmitted infections (STIs). STIs include gonorrhea, chlamydia, syphilis, trichomonas, herpes, HPV, and human immunodeficiency virus (HIV). Herpes, HIV, and HPV are viral illnesses that have no cure. They can result in disability, cancer, and death. Sexually active women aged 25 and younger should be checked for chlamydia. Older women with new or multiple partners should also be tested for chlamydia. Testing for other STIs is recommended if you are sexually active and at increased risk.   Osteoporosis is a disease in which the bones lose minerals and strength with aging. This can result in serious bone fractures. The risk of osteoporosis can be identified using a bone density scan. Women ages 65 and over and women at risk for fractures or osteoporosis should discuss screening with their caregivers. Ask your caregiver whether you should take a calcium supplement or vitamin D to reduce the rate of osteoporosis.   Menopause can be associated with physical symptoms and risks. Hormone replacement therapy is available to decrease symptoms and risks. You should talk to your caregiver about whether hormone replacement therapy is right for you.   Use sunscreen with sun protection factor (SPF) of 30 or more. Apply sunscreen liberally and repeatedly throughout the day. You should seek shade when your shadow is shorter than you. Protect yourself by wearing long sleeves, pants, a wide-brimmed hat, and sunglasses year round, whenever you are outdoors.   Once a month, do a whole body skin exam, using a mirror to look at the skin on your back. Notify your caregiver of new moles, moles that have irregular borders, moles that are larger than a pencil eraser, or moles that have changed in shape or color.   Stay current with required immunizations.   Influenza. You need a dose every fall (or winter). The  composition of the flu vaccine changes each year, so being vaccinated once is not enough.   Pneumococcal polysaccharide. You need 1 to 2 doses if you smoke cigarettes or if you have certain chronic medical conditions. You need 1 dose at age 65 (or older) if you have never been vaccinated.   Tetanus, diphtheria, pertussis (Tdap, Td). Get 1 dose of   Tdap vaccine if you are younger than age 65, are over 65 and have contact with an infant, are a healthcare worker, are pregnant, or simply want to be protected from whooping cough. After that, you need a Td booster dose every 10 years. Consult your caregiver if you have not had at least 3 tetanus and diphtheria-containing shots sometime in your life or have a deep or dirty wound.   HPV. You need this vaccine if you are a woman age 26 or younger. The vaccine is given in 3 doses over 6 months.   Measles, mumps, rubella (MMR). You need at least 1 dose of MMR if you were born in 1957 or later. You may also need a second dose.   Meningococcal. If you are age 19 to 21 and a first-year college student living in a residence hall, or have one of several medical conditions, you need to get vaccinated against meningococcal disease. You may also need additional booster doses.   Zoster (shingles). If you are age 60 or older, you should get this vaccine.   Varicella (chickenpox). If you have never had chickenpox or you were vaccinated but received only 1 dose, talk to your caregiver to find out if you need this vaccine.   Hepatitis A. You need this vaccine if you have a specific risk factor for hepatitis A virus infection or you simply wish to be protected from this disease. The vaccine is usually given as 2 doses, 6 to 18 months apart.   Hepatitis B. You need this vaccine if you have a specific risk factor for hepatitis B virus infection or you simply wish to be protected from this disease. The vaccine is given in 3 doses, usually over 6 months.  Preventive Services /  Frequency Ages 40 to 64  Blood pressure check.** / Every 1 to 2 years.   Lipid and cholesterol check.** / Every 5 years beginning at age 20.   Clinical breast exam.** / Every year after age 40.   Mammogram.** / Every year beginning at age 40 and continuing for as long as you are in good health. Consult with your caregiver.   Pap test.** / Every 3 years starting at age 30 through age 65 or 70 with a history of 3 consecutive normal Pap tests.   HPV screening.** / Every 3 years from ages 30 through ages 65 to 70 with a history of 3 consecutive normal Pap tests.   Fecal occult blood test (FOBT) of stool. / Every year beginning at age 50 and continuing until age 75. You may not need to do this test if you get a colonoscopy every 10 years.   Flexible sigmoidoscopy or colonoscopy.** / Every 5 years for a flexible sigmoidoscopy or every 10 years for a colonoscopy beginning at age 50 and continuing until age 75.   Hepatitis C blood test.** / For all people born from 1945 through 1965 and any individual with known risks for hepatitis C.   Skin self-exam. / Monthly.   Influenza immunization.** / Every year.   Pneumococcal polysaccharide immunization.** / 1 to 2 doses if you smoke cigarettes or if you have certain chronic medical conditions.   Tetanus, diphtheria, pertussis (Tdap, Td) immunization.** / A one-time dose of Tdap vaccine. After that, you need a Td booster dose every 10 years.   Measles, mumps, rubella (MMR) immunization. / You need at least 1 dose of MMR if you were born in 1957 or later. You may also need a   second dose.   Varicella immunization.** / Consult your caregiver.   Meningococcal immunization.** / Consult your caregiver.   Hepatitis A immunization.** / Consult your caregiver. 2 doses, 6 to 18 months apart.   Hepatitis B immunization.** / Consult your caregiver. 3 doses, usually over 6 months.  ** Family history and personal history of risk and conditions may change  your caregiver's recommendations. Document Released: 02/19/2001 Document Revised: 12/13/2010 Document Reviewed: 05/21/2010 [employer] Patient Information 2012 [employer]. 

## 2011-08-13 NOTE — Progress Notes (Signed)
Patient is here for yearly exam

## 2012-01-27 ENCOUNTER — Ambulatory Visit
Admission: RE | Admit: 2012-01-27 | Discharge: 2012-01-27 | Disposition: A | Discharge status: Home / Self Care | Payer: BC Managed Care – PPO | Source: Ambulatory Visit | Attending: Obstetrics and Gynecology | Admitting: Obstetrics and Gynecology

## 2012-01-27 DIAGNOSIS — Z01419 Encounter for gynecological examination (general) (routine) without abnormal findings: Secondary | ICD-10-CM

## 2012-10-22 ENCOUNTER — Encounter: Payer: Self-pay | Admitting: Obstetrics and Gynecology

## 2012-10-22 ENCOUNTER — Ambulatory Visit (INDEPENDENT_AMBULATORY_CARE_PROVIDER_SITE_OTHER): Payer: BC Managed Care – PPO | Admitting: Obstetrics and Gynecology

## 2012-10-22 VITALS — BP 112/72 | HR 61 | Ht 68.0 in | Wt 245.0 lb

## 2012-10-22 DIAGNOSIS — Z124 Encounter for screening for malignant neoplasm of cervix: Secondary | ICD-10-CM

## 2012-10-22 DIAGNOSIS — Z1151 Encounter for screening for human papillomavirus (HPV): Secondary | ICD-10-CM

## 2012-10-22 DIAGNOSIS — Z01419 Encounter for gynecological examination (general) (routine) without abnormal findings: Secondary | ICD-10-CM

## 2012-10-22 NOTE — Patient Instructions (Signed)
Preventive Care for Adults, Female A healthy lifestyle and preventive care can promote health and wellness. Preventive health guidelines for women include the following key practices.  A routine yearly physical is a good way to check with your caregiver about your health and preventive screening. It is a chance to share any concerns and updates on your health, and to receive a thorough exam.  Visit your dentist for a routine exam and preventive care every 6 months. Brush your teeth twice a day and floss once a day. Good oral hygiene prevents tooth decay and gum disease.  The frequency of eye exams is based on your age, health, family medical history, use of contact lenses, and other factors. Follow your caregiver's recommendations for frequency of eye exams.  Eat a healthy diet. Foods like vegetables, fruits, whole grains, low-fat dairy products, and lean protein foods contain the nutrients you need without too many calories. Decrease your intake of foods high in solid fats, added sugars, and salt. Eat the right amount of calories for you.Get information about a proper diet from your caregiver, if necessary.  Regular physical exercise is one of the most important things you can do for your health. Most adults should get at least 150 minutes of moderate-intensity exercise (any activity that increases your heart rate and causes you to sweat) each week. In addition, most adults need muscle-strengthening exercises on 2 or more days a week.  Maintain a healthy weight. The body mass index (BMI) is a screening tool to identify possible weight problems. It provides an estimate of body fat based on height and weight. Your caregiver can help determine your BMI, and can help you achieve or maintain a healthy weight.For adults 20 years and older:  A BMI below 18.5 is considered underweight.  A BMI of 18.5 to 24.9 is normal.  A BMI of 25 to 29.9 is considered overweight.  A BMI of 30 and above is  considered obese.  Maintain normal blood lipids and cholesterol levels by exercising and minimizing your intake of saturated fat. Eat a balanced diet with plenty of fruit and vegetables. Blood tests for lipids and cholesterol should begin at age 20 and be repeated every 5 years. If your lipid or cholesterol levels are high, you are over 50, or you are at high risk for heart disease, you may need your cholesterol levels checked more frequently.Ongoing high lipid and cholesterol levels should be treated with medicines if diet and exercise are not effective.  If you smoke, find out from your caregiver how to quit. If you do not use tobacco, do not start.  If you are pregnant, do not drink alcohol. If you are breastfeeding, be very cautious about drinking alcohol. If you are not pregnant and choose to drink alcohol, do not exceed 1 drink per day. One drink is considered to be 12 ounces (355 mL) of beer, 5 ounces (148 mL) of wine, or 1.5 ounces (44 mL) of liquor.  Avoid use of street drugs. Do not share needles with anyone. Ask for help if you need support or instructions about stopping the use of drugs.  High blood pressure causes heart disease and increases the risk of stroke. Your blood pressure should be checked at least every 1 to 2 years. Ongoing high blood pressure should be treated with medicines if weight loss and exercise are not effective.  If you are 55 to 46 years old, ask your caregiver if you should take aspirin to prevent strokes.  Diabetes   screening involves taking a blood sample to check your fasting blood sugar level. This should be done once every 3 years, after age 45, if you are within normal weight and without risk factors for diabetes. Testing should be considered at a younger age or be carried out more frequently if you are overweight and have at least 1 risk factor for diabetes.  Breast cancer screening is essential preventive care for women. You should practice "breast  self-awareness." This means understanding the normal appearance and feel of your breasts and may include breast self-examination. Any changes detected, no matter how small, should be reported to a caregiver. Women in their 20s and 30s should have a clinical breast exam (CBE) by a caregiver as part of a regular health exam every 1 to 3 years. After age 40, women should have a CBE every year. Starting at age 40, women should consider having a mammography (breast X-ray test) every year. Women who have a family history of breast cancer should talk to their caregiver about genetic screening. Women at a high risk of breast cancer should talk to their caregivers about having magnetic resonance imaging (MRI) and a mammography every year.  The Pap test is a screening test for cervical cancer. A Pap test can show cell changes on the cervix that might become cervical cancer if left untreated. A Pap test is a procedure in which cells are obtained and examined from the lower end of the uterus (cervix).  Women should have a Pap test starting at age 21.  Between ages 21 and 29, Pap tests should be repeated every 2 years.  Beginning at age 30, you should have a Pap test every 3 years as long as the past 3 Pap tests have been normal.  Some women have medical problems that increase the chance of getting cervical cancer. Talk to your caregiver about these problems. It is especially important to talk to your caregiver if a new problem develops soon after your last Pap test. In these cases, your caregiver may recommend more frequent screening and Pap tests.  The above recommendations are the same for women who have or have not gotten the vaccine for human papillomavirus (HPV).  If you had a hysterectomy for a problem that was not cancer or a condition that could lead to cancer, then you no longer need Pap tests. Even if you no longer need a Pap test, a regular exam is a good idea to make sure no other problems are  starting.  If you are between ages 65 and 70, and you have had normal Pap tests going back 10 years, you no longer need Pap tests. Even if you no longer need a Pap test, a regular exam is a good idea to make sure no other problems are starting.  If you have had past treatment for cervical cancer or a condition that could lead to cancer, you need Pap tests and screening for cancer for at least 20 years after your treatment.  If Pap tests have been discontinued, risk factors (such as a new sexual partner) need to be reassessed to determine if screening should be resumed.  The HPV test is an additional test that may be used for cervical cancer screening. The HPV test looks for the virus that can cause the cell changes on the cervix. The cells collected during the Pap test can be tested for HPV. The HPV test could be used to screen women aged 30 years and older, and should   be used in women of any age who have unclear Pap test results. After the age of 30, women should have HPV testing at the same frequency as a Pap test.  Colorectal cancer can be detected and often prevented. Most routine colorectal cancer screening begins at the age of 50 and continues through age 75. However, your caregiver may recommend screening at an earlier age if you have risk factors for colon cancer. On a yearly basis, your caregiver may provide home test kits to check for hidden blood in the stool. Use of a small camera at the end of a tube, to directly examine the colon (sigmoidoscopy or colonoscopy), can detect the earliest forms of colorectal cancer. Talk to your caregiver about this at age 50, when routine screening begins. Direct examination of the colon should be repeated every 5 to 10 years through age 75, unless early forms of pre-cancerous polyps or small growths are found.  Hepatitis C blood testing is recommended for all people born from 1945 through 1965 and any individual with known risks for hepatitis C.  Practice  safe sex. Use condoms and avoid high-risk sexual practices to reduce the spread of sexually transmitted infections (STIs). STIs include gonorrhea, chlamydia, syphilis, trichomonas, herpes, HPV, and human immunodeficiency virus (HIV). Herpes, HIV, and HPV are viral illnesses that have no cure. They can result in disability, cancer, and death. Sexually active women aged 25 and younger should be checked for chlamydia. Older women with new or multiple partners should also be tested for chlamydia. Testing for other STIs is recommended if you are sexually active and at increased risk.  Osteoporosis is a disease in which the bones lose minerals and strength with aging. This can result in serious bone fractures. The risk of osteoporosis can be identified using a bone density scan. Women ages 65 and over and women at risk for fractures or osteoporosis should discuss screening with their caregivers. Ask your caregiver whether you should take a calcium supplement or vitamin D to reduce the rate of osteoporosis.  Menopause can be associated with physical symptoms and risks. Hormone replacement therapy is available to decrease symptoms and risks. You should talk to your caregiver about whether hormone replacement therapy is right for you.  Use sunscreen with sun protection factor (SPF) of 30 or more. Apply sunscreen liberally and repeatedly throughout the day. You should seek shade when your shadow is shorter than you. Protect yourself by wearing long sleeves, pants, a wide-brimmed hat, and sunglasses year round, whenever you are outdoors.  Once a month, do a whole body skin exam, using a mirror to look at the skin on your back. Notify your caregiver of new moles, moles that have irregular borders, moles that are larger than a pencil eraser, or moles that have changed in shape or color.  Stay current with required immunizations.  Influenza. You need a dose every fall (or winter). The composition of the flu vaccine  changes each year, so being vaccinated once is not enough.  Pneumococcal polysaccharide. You need 1 to 2 doses if you smoke cigarettes or if you have certain chronic medical conditions. You need 1 dose at age 65 (or older) if you have never been vaccinated.  Tetanus, diphtheria, pertussis (Tdap, Td). Get 1 dose of Tdap vaccine if you are younger than age 65, are over 65 and have contact with an infant, are a healthcare worker, are pregnant, or simply want to be protected from whooping cough. After that, you need a Td   booster dose every 10 years. Consult your caregiver if you have not had at least 3 tetanus and diphtheria-containing shots sometime in your life or have a deep or dirty wound.  HPV. You need this vaccine if you are a woman age 26 or younger. The vaccine is given in 3 doses over 6 months.  Measles, mumps, rubella (MMR). You need at least 1 dose of MMR if you were born in 1957 or later. You may also need a second dose.  Meningococcal. If you are age 19 to 21 and a first-year college student living in a residence hall, or have one of several medical conditions, you need to get vaccinated against meningococcal disease. You may also need additional booster doses.  Zoster (shingles). If you are age 60 or older, you should get this vaccine.  Varicella (chickenpox). If you have never had chickenpox or you were vaccinated but received only 1 dose, talk to your caregiver to find out if you need this vaccine.  Hepatitis A. You need this vaccine if you have a specific risk factor for hepatitis A virus infection or you simply wish to be protected from this disease. The vaccine is usually given as 2 doses, 6 to 18 months apart.  Hepatitis B. You need this vaccine if you have a specific risk factor for hepatitis B virus infection or you simply wish to be protected from this disease. The vaccine is given in 3 doses, usually over 6 months. Preventive Services / Frequency Ages 19 to 39  Blood  pressure check.** / Every 1 to 2 years.  Lipid and cholesterol check.** / Every 5 years beginning at age 20.  Clinical breast exam.** / Every 3 years for women in their 20s and 30s.  Pap test.** / Every 2 years from ages 21 through 29. Every 3 years starting at age 30 through age 65 or 70 with a history of 3 consecutive normal Pap tests.  HPV screening.** / Every 3 years from ages 30 through ages 65 to 70 with a history of 3 consecutive normal Pap tests.  Hepatitis C blood test.** / For any individual with known risks for hepatitis C.  Skin self-exam. / Monthly.  Influenza immunization.** / Every year.  Pneumococcal polysaccharide immunization.** / 1 to 2 doses if you smoke cigarettes or if you have certain chronic medical conditions.  Tetanus, diphtheria, pertussis (Tdap, Td) immunization. / A one-time dose of Tdap vaccine. After that, you need a Td booster dose every 10 years.  HPV immunization. / 3 doses over 6 months, if you are 26 and younger.  Measles, mumps, rubella (MMR) immunization. / You need at least 1 dose of MMR if you were born in 1957 or later. You may also need a second dose.  Meningococcal immunization. / 1 dose if you are age 19 to 21 and a first-year college student living in a residence hall, or have one of several medical conditions, you need to get vaccinated against meningococcal disease. You may also need additional booster doses.  Varicella immunization.** / Consult your caregiver.  Hepatitis A immunization.** / Consult your caregiver. 2 doses, 6 to 18 months apart.  Hepatitis B immunization.** / Consult your caregiver. 3 doses usually over 6 months. Ages 40 to 64  Blood pressure check.** / Every 1 to 2 years.  Lipid and cholesterol check.** / Every 5 years beginning at age 20.  Clinical breast exam.** / Every year after age 40.  Mammogram.** / Every year beginning at age 40   and continuing for as long as you are in good health. Consult with your  caregiver.  Pap test.** / Every 3 years starting at age 30 through age 65 or 70 with a history of 3 consecutive normal Pap tests.  HPV screening.** / Every 3 years from ages 30 through ages 65 to 70 with a history of 3 consecutive normal Pap tests.  Fecal occult blood test (FOBT) of stool. / Every year beginning at age 50 and continuing until age 75. You may not need to do this test if you get a colonoscopy every 10 years.  Flexible sigmoidoscopy or colonoscopy.** / Every 5 years for a flexible sigmoidoscopy or every 10 years for a colonoscopy beginning at age 50 and continuing until age 75.  Hepatitis C blood test.** / For all people born from 1945 through 1965 and any individual with known risks for hepatitis C.  Skin self-exam. / Monthly.  Influenza immunization.** / Every year.  Pneumococcal polysaccharide immunization.** / 1 to 2 doses if you smoke cigarettes or if you have certain chronic medical conditions.  Tetanus, diphtheria, pertussis (Tdap, Td) immunization.** / A one-time dose of Tdap vaccine. After that, you need a Td booster dose every 10 years.  Measles, mumps, rubella (MMR) immunization. / You need at least 1 dose of MMR if you were born in 1957 or later. You may also need a second dose.  Varicella immunization.** / Consult your caregiver.  Meningococcal immunization.** / Consult your caregiver.  Hepatitis A immunization.** / Consult your caregiver. 2 doses, 6 to 18 months apart.  Hepatitis B immunization.** / Consult your caregiver. 3 doses, usually over 6 months. Ages 65 and over  Blood pressure check.** / Every 1 to 2 years.  Lipid and cholesterol check.** / Every 5 years beginning at age 20.  Clinical breast exam.** / Every year after age 40.  Mammogram.** / Every year beginning at age 40 and continuing for as long as you are in good health. Consult with your caregiver.  Pap test.** / Every 3 years starting at age 30 through age 65 or 70 with a 3  consecutive normal Pap tests. Testing can be stopped between 65 and 70 with 3 consecutive normal Pap tests and no abnormal Pap or HPV tests in the past 10 years.  HPV screening.** / Every 3 years from ages 30 through ages 65 or 70 with a history of 3 consecutive normal Pap tests. Testing can be stopped between 65 and 70 with 3 consecutive normal Pap tests and no abnormal Pap or HPV tests in the past 10 years.  Fecal occult blood test (FOBT) of stool. / Every year beginning at age 50 and continuing until age 75. You may not need to do this test if you get a colonoscopy every 10 years.  Flexible sigmoidoscopy or colonoscopy.** / Every 5 years for a flexible sigmoidoscopy or every 10 years for a colonoscopy beginning at age 50 and continuing until age 75.  Hepatitis C blood test.** / For all people born from 1945 through 1965 and any individual with known risks for hepatitis C.  Osteoporosis screening.** / A one-time screening for women ages 65 and over and women at risk for fractures or osteoporosis.  Skin self-exam. / Monthly.  Influenza immunization.** / Every year.  Pneumococcal polysaccharide immunization.** / 1 dose at age 65 (or older) if you have never been vaccinated.  Tetanus, diphtheria, pertussis (Tdap, Td) immunization. / A one-time dose of Tdap vaccine if you are over   65 and have contact with an infant, are a healthcare worker, or simply want to be protected from whooping cough. After that, you need a Td booster dose every 10 years.  Varicella immunization.** / Consult your caregiver.  Meningococcal immunization.** / Consult your caregiver.  Hepatitis A immunization.** / Consult your caregiver. 2 doses, 6 to 18 months apart.  Hepatitis B immunization.** / Check with your caregiver. 3 doses, usually over 6 months. ** Family history and personal history of risk and conditions may change your caregiver's recommendations. Document Released: 02/19/2001 Document Revised: 03/18/2011  Document Reviewed: 05/21/2010 [employer] Patient Information 2014 [employer].  

## 2012-10-22 NOTE — Progress Notes (Signed)
  Subjective:     [name] is a 47 y.o. female G1P1 with BMI 37 who is here for a comprehensive physical exam. The patient reports no problems. She is sexually active using OCP for contraception and is content with that method  History   Social History  . Marital Status: Single    Spouse Name: N/A    Number of Children: N/A  . Years of Education: N/A   Occupational History  . Not on file.   Social History Main Topics  . Smoking status: Never Smoker   . Smokeless tobacco: Never Used  . Alcohol Use: No  . Drug Use: No  . Sexual Activity: No   Other Topics Concern  . Not on file   Social History Narrative  . No narrative on file   Health Maintenance  Topic Date Due  . Tetanus/tdap  05/28/1985  . Influenza Vaccine  08/07/2012  . Pap Smear  08/13/2014   Past Medical History  Diagnosis Date  . History of abnormal Pap smear    Past Surgical History  Procedure Laterality Date  . Cesarean section    . Breast lumpectomy     Family History  Problem Relation Age of Onset  . Breast cancer Paternal Grandmother 60    deceased at 34  . Diabetes Maternal Grandfather   . Hypertension Father   . Colon polyps Father   . Diabetes Mother   . Diverticulitis Mother   . Breast cancer Paternal Aunt     Paternal/unsure of age  . Breast cancer Cousin 24     3 paternal cousin  . Breast cancer Cousin 40    paternal  . Breast cancer Cousin     paternal   History  Substance Use Topics  . Smoking status: Never Smoker   . Smokeless tobacco: Never Used  . Alcohol Use: No       Review of Systems A comprehensive review of systems was negative.   Objective:      GENERAL: Well-developed, well-nourished female in no acute distress.  HEENT: Normocephalic, atraumatic. Sclerae anicteric.  NECK: Supple. Normal thyroid.  LUNGS: Clear to auscultation bilaterally.  HEART: Regular rate and rhythm. BREASTS: Symmetric in size. No palpable masses or lymphadenopathy, skin  changes, or nipple drainage. ABDOMEN: Soft, nontender, nondistended. No organomegaly. PELVIC: Normal external female genitalia. Vagina is pink and rugated.  Normal discharge. Normal appearing cervix. Uterus is normal in size. No adnexal mass or tenderness. EXTREMITIES: No cyanosis, clubbing, or edema, 2+ distal pulses.    Assessment:    Healthy female exam.      Plan:    pap smear collected Normal screening mammogram this year per patient Patient advised to perform monthly self breast and vulva exams Patient to return at her discretion for fasting labs.  See After Visit Summary for Counseling Recommendations

## 2013-01-05 ENCOUNTER — Encounter: Payer: BC Managed Care – PPO | Admitting: Obstetrics & Gynecology

## 2013-01-20 ENCOUNTER — Ambulatory Visit (INDEPENDENT_AMBULATORY_CARE_PROVIDER_SITE_OTHER): Payer: BC Managed Care – PPO | Admitting: Obstetrics & Gynecology

## 2013-01-20 ENCOUNTER — Encounter: Payer: Self-pay | Admitting: Obstetrics & Gynecology

## 2013-01-20 VITALS — BP 124/71 | HR 57 | Resp 16 | Ht 68.0 in | Wt 241.0 lb

## 2013-01-20 DIAGNOSIS — Z01812 Encounter for preprocedural laboratory examination: Secondary | ICD-10-CM

## 2013-01-20 DIAGNOSIS — N870 Mild cervical dysplasia: Secondary | ICD-10-CM

## 2013-01-20 DIAGNOSIS — R87613 High grade squamous intraepithelial lesion on cytologic smear of cervix (HGSIL): Secondary | ICD-10-CM

## 2013-01-20 LAB — POCT URINE PREGNANCY: PREG TEST UR: NEGATIVE

## 2013-01-20 NOTE — Patient Instructions (Addendum)
Loop Electrosurgical Excision Procedure Care After Refer to this sheet in the next few weeks. These instructions provide you with information on caring for yourself after your procedure. Your caregiver may also give you more specific instructions. Your treatment has been planned according to current medical practices, but problems sometimes occur. Call your caregiver if you have any problems or questions after your procedure. HOME CARE INSTRUCTIONS   Do not use tampons, douche, or have sexual intercourse for 4 weeks or as directed by your caregiver.  Begin normal activities if you have no or minimal cramping or bleeding, unless directed otherwise by your caregiver.  Take your temperature if you feel sick. Write down your temperature on paper, and tell your caregiver if you have a fever.  Take all medicines as directed by your caregiver.  Keep all your follow-up appointments and Pap tests as directed by your caregiver. SEEK IMMEDIATE MEDICAL CARE IF:   You have bleeding that is heavier or longer than a normal menstrual cycle.  You have bleeding that is bright red.  You have blood clots.  You have a fever.  You have increasing cramps or pain not relieved by medicine.  You develop abdominal pain that does not seem to be related to the same area of earlier cramping and pain.  You are lightheaded, unusually weak, or faint.  You develop painful or bloody urination.  You develop a bad smelling vaginal discharge. MAKE SURE YOU:  Understand these instructions.  Will watch your condition.  Will get help right away if you are not doing well or get worse. Document Released: 09/06/2010 Document Revised: 03/18/2011 Document Reviewed: 09/06/2010 [facility] Patient Information 2014 [employer].

## 2013-01-20 NOTE — Progress Notes (Signed)
    LEEP PROCEDURE NOTE  Pap smears and colposcopy reviewed.   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 Colposcopy Biopsy 09/28/10 CIN, negative ECC. Offered LEEP vs surveillance; she opted for surveillance (refer to 10/29/10 note). 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 THERE ARE ATYPICAL GLANDS PRESENT WHICH MAY INDICATE POSSIBLE GLAND INVOLVEMENT WITH DYSPLASIA.   Given HGSIL recurrence and possible gland involvement, I recommended LEEP.  This is also a recommendation as per ASCCP guidelines.  Patient agreed to this procedure.  Risks, benefits, alternatives, and limitations of procedure explained to patient, including pain, bleeding, infection, failure to remove abnormal tissue and failure to cure dysplasia, need for repeat procedures, damage to pelvic organs, cervical incompetence.  Role of HPV,cervical dysplasia and need for close followup was empasized. Informed written consent was obtained. All questions were answered. Time out performed.   ??Procedure: The patient was placed in lithotomy position and the bivalved coated speculum was placed in the patient's vagina. A grounding pad placed on the patient. Lugol's solution was applied to the cervix and areas of decreased uptake were noted around the transformation zone.   Local anesthesia was administered via an intracervical block using 10cc of 2% Lidocaine with epinephrine. The suction was turned on and the Medium Extended 1X Fisher Cone Biopsy Excisor on 30 Watts of cutting current was used to excise the area of decreased uptake and excise the entire transformation zone. Excellent hemostasis was achieved using roller ball coagulation set at 50 Watts coagulation current. Monsel's solution was then applied and the speculum was removed from the vagina. Specimens were sent to pathology.  ?The patient tolerated the  procedure well. Post-operative instructions given to patient, including instruction to seek medical attention for persistent bright red bleeding, fever, abdominal/pelvic pain, dysuria, nausea or vomiting. She was also told about the possibility of having copious yellow to black tinged discharge for weeks. She was counseled to avoid anything in the vagina (sex/douching/tampons) for 4 weeks. She has a 4 week post-operative check to assess wound healing, review results and discuss further management.     Verita Schneiders, MD, [facility] Attending [place], [place]

## 2013-01-22 ENCOUNTER — Encounter: Payer: Self-pay | Admitting: Obstetrics & Gynecology

## 2013-01-25 ENCOUNTER — Telehealth: Payer: Self-pay | Admitting: *Deleted

## 2013-01-25 NOTE — Telephone Encounter (Signed)
Called pt to adv LEEP path shows same as previous pap and will need to repeat pap in 6 months to be sure its clear. Pt expressed understanding and will schedule pap appt at next visit

## 2013-02-24 ENCOUNTER — Ambulatory Visit (INDEPENDENT_AMBULATORY_CARE_PROVIDER_SITE_OTHER): Payer: BC Managed Care – PPO | Admitting: Obstetrics & Gynecology

## 2013-02-24 ENCOUNTER — Encounter: Payer: Self-pay | Admitting: Obstetrics & Gynecology

## 2013-02-24 VITALS — BP 120/74 | HR 72 | Ht 68.0 in | Wt 242.0 lb

## 2013-02-24 DIAGNOSIS — Z9889 Other specified postprocedural states: Secondary | ICD-10-CM

## 2013-02-24 DIAGNOSIS — R87613 High grade squamous intraepithelial lesion on cytologic smear of cervix (HGSIL): Secondary | ICD-10-CM

## 2013-02-24 DIAGNOSIS — D069 Carcinoma in situ of cervix, unspecified: Secondary | ICD-10-CM

## 2013-02-24 NOTE — Patient Instructions (Signed)
Return to clinic for any scheduled appointments or for any gynecologic concerns as needed.   

## 2013-02-24 NOTE — Progress Notes (Signed)
   CLINIC ENCOUNTER NOTE  History:  48 y.o. G1P1 here today for follow up after LEEP on 01/20/2013 for HGSIL pap smear; please see pap smear and colposcopy history.  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  Colposcopy Biopsy 09/28/10 CIN, negative ECC. Offered LEEP vs surveillance; she opted for surveillance (refer to 10/29/10 note).  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 THERE ARE ATYPICAL GLANDS PRESENT WHICH MAY INDICATE POSSIBLE GLAND INVOLVEMENT WITH DYSPLASIA.   The following portions of the patient's history were reviewed and updated as appropriate: allergies, current medications, past family history, past medical history, past social history, past surgical history and problem list.  Review of Systems:  Pertinent items are noted in HPI.  Objective:  Physical Exam BP 120/74  Pulse 72  Ht 5\' 8"  (1.727 m)  Wt 242 lb (109.77 kg)  BMI 36.80 kg/m2 Gen: NAD Abd: Soft, nontender and nondistended Pelvic: Normal appearing external genitalia; normal appearing vaginal mucosa and well-healed cervix.  Normal discharge.  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    Assessment & Plan:  Will repeat cotesting in 07/2013.   Verita Schneiders, MD, [facility] Attending [place], [place]

## 2013-03-22 ENCOUNTER — Other Ambulatory Visit: Payer: Self-pay | Admitting: Obstetrics and Gynecology

## 2013-03-22 ENCOUNTER — Other Ambulatory Visit: Payer: Self-pay

## 2013-03-22 DIAGNOSIS — Z1231 Encounter for screening mammogram for malignant neoplasm of breast: Secondary | ICD-10-CM

## 2013-03-23 ENCOUNTER — Ambulatory Visit
Admission: RE | Admit: 2013-03-23 | Discharge: 2013-03-23 | Disposition: A | Discharge status: Home / Self Care | Payer: BC Managed Care – PPO | Source: Ambulatory Visit

## 2013-03-23 DIAGNOSIS — Z1231 Encounter for screening mammogram for malignant neoplasm of breast: Secondary | ICD-10-CM

## 2013-10-04 ENCOUNTER — Ambulatory Visit: Payer: BC Managed Care – PPO | Admitting: Obstetrics & Gynecology

## 2013-10-19 ENCOUNTER — Encounter: Payer: Self-pay | Admitting: Obstetrics & Gynecology

## 2013-10-19 ENCOUNTER — Ambulatory Visit (INDEPENDENT_AMBULATORY_CARE_PROVIDER_SITE_OTHER): Payer: BC Managed Care – PPO | Admitting: Obstetrics & Gynecology

## 2013-10-19 VITALS — BP 120/81 | HR 74 | Ht 68.0 in | Wt 250.6 lb

## 2013-10-19 DIAGNOSIS — Z1151 Encounter for screening for human papillomavirus (HPV): Secondary | ICD-10-CM

## 2013-10-19 DIAGNOSIS — Z01419 Encounter for gynecological examination (general) (routine) without abnormal findings: Secondary | ICD-10-CM

## 2013-10-19 DIAGNOSIS — D069 Carcinoma in situ of cervix, unspecified: Secondary | ICD-10-CM

## 2013-10-19 DIAGNOSIS — Z124 Encounter for screening for malignant neoplasm of cervix: Secondary | ICD-10-CM

## 2013-10-19 DIAGNOSIS — Z3041 Encounter for surveillance of contraceptive pills: Secondary | ICD-10-CM

## 2013-10-19 DIAGNOSIS — R87613 High grade squamous intraepithelial lesion on cytologic smear of cervix (HGSIL): Secondary | ICD-10-CM | POA: Diagnosis not present

## 2013-10-19 MED ORDER — LEVONORGEST-ETH ESTRAD 91-DAY 0.15-0.03 &0.01 MG PO TABS: 1.0000 | ORAL_TABLET | Freq: Every day | ORAL | Status: DC

## 2013-10-19 NOTE — Patient Instructions (Addendum)
Return to clinic for any scheduled appointments or for any gynecologic concerns as needed.  Preventive Care for Adults A healthy lifestyle and preventive care can promote health and wellness. Preventive health guidelines for women include the following key practices.  A routine yearly physical is a good way to check with your health care provider about your health and preventive screening. It is a chance to share any concerns and updates on your health and to receive a thorough exam.  Visit your dentist for a routine exam and preventive care every 6 months. Brush your teeth twice a day and floss once a day. Good oral hygiene prevents tooth decay and gum disease.  The frequency of eye exams is based on your age, health, family medical history, use of contact lenses, and other factors. Follow your health care provider's recommendations for frequency of eye exams.  Eat a healthy diet. Foods like vegetables, fruits, whole grains, low-fat dairy products, and lean protein foods contain the nutrients you need without too many calories. Decrease your intake of foods high in solid fats, added sugars, and salt. Eat the right amount of calories for you.Get information about a proper diet from your health care provider, if necessary.  Regular physical exercise is one of the most important things you can do for your health. Most adults should get at least 150 minutes of moderate-intensity exercise (any activity that increases your heart rate and causes you to sweat) each week. In addition, most adults need muscle-strengthening exercises on 2 or more days a week.  Maintain a healthy weight. The body mass index (BMI) is a screening tool to identify possible weight problems. It provides an estimate of body fat based on height and weight. Your health care provider can find your BMI and can help you achieve or maintain a healthy weight.For adults 20 years and older:  A BMI below 18.5 is considered underweight.  A  BMI of 18.5 to 24.9 is normal.  A BMI of 25 to 29.9 is considered overweight.  A BMI of 30 and above is considered obese.  Maintain normal blood lipids and cholesterol levels by exercising and minimizing your intake of saturated fat. Eat a balanced diet with plenty of fruit and vegetables. Blood tests for lipids and cholesterol should begin at age 28 and be repeated every 5 years. If your lipid or cholesterol levels are high, you are over 50, or you are at high risk for heart disease, you may need your cholesterol levels checked more frequently.Ongoing high lipid and cholesterol levels should be treated with medicines if diet and exercise are not working.  If you smoke, find out from your health care provider how to quit. If you do not use tobacco, do not start.  Lung cancer screening is recommended for adults aged 77-80 years who are at high risk for developing lung cancer because of a history of smoking. A yearly low-dose CT scan of the lungs is recommended for people who have at least a 30-pack-year history of smoking and are a current smoker or have quit within the past 15 years. A pack year of smoking is smoking an average of 1 pack of cigarettes a day for 1 year (for example: 1 pack a day for 30 years or 2 packs a day for 15 years). Yearly screening should continue until the smoker has stopped smoking for at least 15 years. Yearly screening should be stopped for people who develop a health problem that would prevent them from having lung  cancer treatment.  If you are pregnant, do not drink alcohol. If you are breastfeeding, be very cautious about drinking alcohol. If you are not pregnant and choose to drink alcohol, do not have more than 1 drink per day. One drink is considered to be 12 ounces (355 mL) of beer, 5 ounces (148 mL) of wine, or 1.5 ounces (44 mL) of liquor.  Avoid use of street drugs. Do not share needles with anyone. Ask for help if you need support or instructions about stopping  the use of drugs.  High blood pressure causes heart disease and increases the risk of stroke. Your blood pressure should be checked at least every 1 to 2 years. Ongoing high blood pressure should be treated with medicines if weight loss and exercise do not work.  If you are 59-61 years old, ask your health care provider if you should take aspirin to prevent strokes.  Diabetes screening involves taking a blood sample to check your fasting blood sugar level. This should be done once every 3 years, after age 24, if you are within normal weight and without risk factors for diabetes. Testing should be considered at a younger age or be carried out more frequently if you are overweight and have at least 1 risk factor for diabetes.  Breast cancer screening is essential preventive care for women. You should practice "breast self-awareness." This means understanding the normal appearance and feel of your breasts and may include breast self-examination. Any changes detected, no matter how small, should be reported to a health care provider. Women in their 32s and 30s should have a clinical breast exam (CBE) by a health care provider as part of a regular health exam every 1 to 3 years. After age 53, women should have a CBE every year. Starting at age 63, women should consider having a mammogram (breast X-ray test) every year. Women who have a family history of breast cancer should talk to their health care provider about genetic screening. Women at a high risk of breast cancer should talk to their health care providers about having an MRI and a mammogram every year.  Breast cancer gene (BRCA)-related cancer risk assessment is recommended for women who have family members with BRCA-related cancers. BRCA-related cancers include breast, ovarian, tubal, and peritoneal cancers. Having family members with these cancers may be associated with an increased risk for harmful changes (mutations) in the breast cancer genes BRCA1  and BRCA2. Results of the assessment will determine the need for genetic counseling and BRCA1 and BRCA2 testing.  Routine pelvic exams to screen for cancer are no longer recommended for nonpregnant women who are considered low risk for cancer of the pelvic organs (ovaries, uterus, and vagina) and who do not have symptoms. Ask your health care provider if a screening pelvic exam is right for you.  If you have had past treatment for cervical cancer or a condition that could lead to cancer, you need Pap tests and screening for cancer for at least 20 years after your treatment. If Pap tests have been discontinued, your risk factors (such as having a new sexual partner) need to be reassessed to determine if screening should be resumed. Some women have medical problems that increase the chance of getting cervical cancer. In these cases, your health care provider may recommend more frequent screening and Pap tests.  The HPV test is an additional test that may be used for cervical cancer screening. The HPV test looks for the virus that can cause  the cell changes on the cervix. The cells collected during the Pap test can be tested for HPV. The HPV test could be used to screen women aged 49 years and older, and should be used in women of any age who have unclear Pap test results. After the age of 65, women should have HPV testing at the same frequency as a Pap test.  Colorectal cancer can be detected and often prevented. Most routine colorectal cancer screening begins at the age of 91 years and continues through age 67 years. However, your health care provider may recommend screening at an earlier age if you have risk factors for colon cancer. On a yearly basis, your health care provider may provide home test kits to check for hidden blood in the stool. Use of a small camera at the end of a tube, to directly examine the colon (sigmoidoscopy or colonoscopy), can detect the earliest forms of colorectal cancer. Talk to  your health care provider about this at age 16, when routine screening begins. Direct exam of the colon should be repeated every 5-10 years through age 32 years, unless early forms of pre-cancerous polyps or small growths are found.  People who are at an increased risk for hepatitis B should be screened for this virus. You are considered at high risk for hepatitis B if:  You were born in a country where hepatitis B occurs often. Talk with your health care provider about which countries are considered high risk.  Your parents were born in a high-risk country and you have not received a shot to protect against hepatitis B (hepatitis B vaccine).  You have HIV or AIDS.  You use needles to inject street drugs.  You live with, or have sex with, someone who has hepatitis B.  You get hemodialysis treatment.  You take certain medicines for conditions like cancer, organ transplantation, and autoimmune conditions.  Hepatitis C blood testing is recommended for all people born from [birth year] through 1965 and any individual with known risks for hepatitis C.  Practice safe sex. Use condoms and avoid high-risk sexual practices to reduce the spread of sexually transmitted infections (STIs). STIs include gonorrhea, chlamydia, syphilis, trichomonas, herpes, HPV, and human immunodeficiency virus (HIV). Herpes, HIV, and HPV are viral illnesses that have no cure. They can result in disability, cancer, and death.  You should be screened for sexually transmitted illnesses (STIs) including gonorrhea and chlamydia if:  You are sexually active and are younger than 24 years.  You are older than 24 years and your health care provider tells you that you are at risk for this type of infection.  Your sexual activity has changed since you were last screened and you are at an increased risk for chlamydia or gonorrhea. Ask your health care provider if you are at risk.  If you are at risk of being infected with HIV, it is  recommended that you take a prescription medicine daily to prevent HIV infection. This is called preexposure prophylaxis (PrEP). You are considered at risk if:  You are a heterosexual woman, are sexually active, and are at increased risk for HIV infection.  You take drugs by injection.  You are sexually active with a partner who has HIV.  Talk with your health care provider about whether you are at high risk of being infected with HIV. If you choose to begin PrEP, you should first be tested for HIV. You should then be tested every 3 months for as long as you are  taking PrEP.  Osteoporosis is a disease in which the bones lose minerals and strength with aging. This can result in serious bone fractures or breaks. The risk of osteoporosis can be identified using a bone density scan. Women ages 52 years and over and women at risk for fractures or osteoporosis should discuss screening with their health care providers. Ask your health care provider whether you should take a calcium supplement or vitamin D to reduce the rate of osteoporosis.  Menopause can be associated with physical symptoms and risks. Hormone replacement therapy is available to decrease symptoms and risks. You should talk to your health care provider about whether hormone replacement therapy is right for you.  Use sunscreen. Apply sunscreen liberally and repeatedly throughout the day. You should seek shade when your shadow is shorter than you. Protect yourself by wearing long sleeves, pants, a wide-brimmed hat, and sunglasses year round, whenever you are outdoors.  Once a month, do a whole body skin exam, using a mirror to look at the skin on your back. Tell your health care provider of new moles, moles that have irregular borders, moles that are larger than a pencil eraser, or moles that have changed in shape or color.  Stay current with required vaccines (immunizations).  Influenza vaccine. All adults should be immunized every  year.  Tetanus, diphtheria, and acellular pertussis (Td, Tdap) vaccine. Pregnant women should receive 1 dose of Tdap vaccine during each pregnancy. The dose should be obtained regardless of the length of time since the last dose. Immunization is preferred during the 27th-36th week of gestation. An adult who has not previously received Tdap or who does not know her vaccine status should receive 1 dose of Tdap. This initial dose should be followed by tetanus and diphtheria toxoids (Td) booster doses every 10 years. Adults with an unknown or incomplete history of completing a 3-dose immunization series with Td-containing vaccines should begin or complete a primary immunization series including a Tdap dose. Adults should receive a Td booster every 10 years.  Varicella vaccine. An adult without evidence of immunity to varicella should receive 2 doses or a second dose if she has previously received 1 dose. Pregnant females who do not have evidence of immunity should receive the first dose after pregnancy. This first dose should be obtained before leaving the health care facility. The second dose should be obtained 4-8 weeks after the first dose.  Human papillomavirus (HPV) vaccine. Females aged 13-26 years who have not received the vaccine previously should obtain the 3-dose series. The vaccine is not recommended for use in pregnant females. However, pregnancy testing is not needed before receiving a dose. If a female is found to be pregnant after receiving a dose, no treatment is needed. In that case, the remaining doses should be delayed until after the pregnancy. Immunization is recommended for any person with an immunocompromised condition through the age of 88 years if she did not get any or all doses earlier. During the 3-dose series, the second dose should be obtained 4-8 weeks after the first dose. The third dose should be obtained 24 weeks after the first dose and 16 weeks after the second dose.  Zoster  vaccine. One dose is recommended for adults aged 15 years or older unless certain conditions are present.  Measles, mumps, and rubella (MMR) vaccine. Adults born before [birth year] generally are considered immune to measles and mumps. Adults born in [birth year] or later should have 1 or more doses of MMR vaccine unless  there is a contraindication to the vaccine or there is laboratory evidence of immunity to each of the three diseases. A routine second dose of MMR vaccine should be obtained at least 28 days after the first dose for students attending postsecondary schools, health care workers, or international travelers. People who received inactivated measles vaccine or an unknown type of measles vaccine during 1963-1967 should receive 2 doses of MMR vaccine. People who received inactivated mumps vaccine or an unknown type of mumps vaccine before 1979 and are at high risk for mumps infection should consider immunization with 2 doses of MMR vaccine. For females of childbearing age, rubella immunity should be determined. If there is no evidence of immunity, females who are not pregnant should be vaccinated. If there is no evidence of immunity, females who are pregnant should delay immunization until after pregnancy. Unvaccinated health care workers born before [birth year] who lack laboratory evidence of measles, mumps, or rubella immunity or laboratory confirmation of disease should consider measles and mumps immunization with 2 doses of MMR vaccine or rubella immunization with 1 dose of MMR vaccine.  Pneumococcal 13-valent conjugate (PCV13) vaccine. When indicated, a person who is uncertain of her immunization history and has no record of immunization should receive the PCV13 vaccine. An adult aged 70 years or older who has certain medical conditions and has not been previously immunized should receive 1 dose of PCV13 vaccine. This PCV13 should be followed with a dose of pneumococcal polysaccharide (PPSV23) vaccine. The PPSV23  vaccine dose should be obtained at least 8 weeks after the dose of PCV13 vaccine. An adult aged 62 years or older who has certain medical conditions and previously received 1 or more doses of PPSV23 vaccine should receive 1 dose of PCV13. The PCV13 vaccine dose should be obtained 1 or more years after the last PPSV23 vaccine dose.  Pneumococcal polysaccharide (PPSV23) vaccine. When PCV13 is also indicated, PCV13 should be obtained first. All adults aged 32 years and older should be immunized. An adult younger than age 1 years who has certain medical conditions should be immunized. Any person who resides in a nursing home or long-term care facility should be immunized. An adult smoker should be immunized. People with an immunocompromised condition and certain other conditions should receive both PCV13 and PPSV23 vaccines. People with human immunodeficiency virus (HIV) infection should be immunized as soon as possible after diagnosis. Immunization during chemotherapy or radiation therapy should be avoided. Routine use of PPSV23 vaccine is not recommended for American Indians, [place] Natives, or people younger than 65 years unless there are medical conditions that require PPSV23 vaccine. When indicated, people who have unknown immunization and have no record of immunization should receive PPSV23 vaccine. One-time revaccination 5 years after the first dose of PPSV23 is recommended for people aged 19-64 years who have chronic kidney failure, nephrotic syndrome, asplenia, or immunocompromised conditions. People who received 1-2 doses of PPSV23 before age 42 years should receive another dose of PPSV23 vaccine at age 36 years or later if at least 5 years have passed since the previous dose. Doses of PPSV23 are not needed for people immunized with PPSV23 at or after age 36 years.  Meningococcal vaccine. Adults with asplenia or persistent complement component deficiencies should receive 2 doses of quadrivalent  meningococcal conjugate (MenACWY-D) vaccine. The doses should be obtained at least 2 months apart. Microbiologists working with certain meningococcal bacteria, [employer] recruits, people at risk during an outbreak, and people who travel to or live in countries with  a high rate of meningitis should be immunized. A first-year college student up through age 37 years who is living in a residence hall should receive a dose if she did not receive a dose on or after her 16th birthday. Adults who have certain high-risk conditions should receive one or more doses of vaccine.  Hepatitis A vaccine. Adults who wish to be protected from this disease, have certain high-risk conditions, work with hepatitis A-infected animals, work in hepatitis A research labs, or travel to or work in countries with a high rate of hepatitis A should be immunized. Adults who were previously unvaccinated and who anticipate close contact with an international adoptee during the first 60 days after arrival in the [place] from a country with a high rate of hepatitis A should be immunized.  Hepatitis B vaccine. Adults who wish to be protected from this disease, have certain high-risk conditions, may be exposed to blood or other infectious body fluids, are household contacts or sex partners of hepatitis B positive people, are clients or workers in certain care facilities, or travel to or work in countries with a high rate of hepatitis B should be immunized.  Haemophilus influenzae type b (Hib) vaccine. A previously unvaccinated person with asplenia or sickle cell disease or having a scheduled splenectomy should receive 1 dose of Hib vaccine. Regardless of previous immunization, a recipient of a hematopoietic stem cell transplant should receive a 3-dose series 6-12 months after her successful transplant. Hib vaccine is not recommended for adults with HIV infection. Preventive Services / Frequency Ages 79 to 78 years  Blood pressure check.**  / Every 1 to 2 years.  Lipid and cholesterol check.** / Every 5 years beginning at age 41.  Clinical breast exam.** / Every 3 years for women in their 61s and 63s.  BRCA-related cancer risk assessment.** / For women who have family members with a BRCA-related cancer (breast, ovarian, tubal, or peritoneal cancers).  Pap test.** / Every 2 years from ages 49 through 35. Every 3 years starting at age 5 through age 52 or 83 with a history of 3 consecutive normal Pap tests.  HPV screening.** / Every 3 years from ages 10 through ages 12 to 42 with a history of 3 consecutive normal Pap tests.  Hepatitis C blood test.** / For any individual with known risks for hepatitis C.  Skin self-exam. / Monthly.  Influenza vaccine. / Every year.  Tetanus, diphtheria, and acellular pertussis (Tdap, Td) vaccine.** / Consult your health care provider. Pregnant women should receive 1 dose of Tdap vaccine during each pregnancy. 1 dose of Td every 10 years.  Varicella vaccine.** / Consult your health care provider. Pregnant females who do not have evidence of immunity should receive the first dose after pregnancy.  HPV vaccine. / 3 doses over 6 months, if 67 and younger. The vaccine is not recommended for use in pregnant females. However, pregnancy testing is not needed before receiving a dose.  Measles, mumps, rubella (MMR) vaccine.** / You need at least 1 dose of MMR if you were born in 1957 or later. You may also need a 2nd dose. For females of childbearing age, rubella immunity should be determined. If there is no evidence of immunity, females who are not pregnant should be vaccinated. If there is no evidence of immunity, females who are pregnant should delay immunization until after pregnancy.  Pneumococcal 13-valent conjugate (PCV13) vaccine.** / Consult your health care provider.  Pneumococcal polysaccharide (PPSV23) vaccine.** / 1 to  2 doses if you smoke cigarettes or if you have certain  conditions.  Meningococcal vaccine.** / 1 dose if you are age 57 to 72 years and a Market researcher living in a residence hall, or have one of several medical conditions, you need to get vaccinated against meningococcal disease. You may also need additional booster doses.  Hepatitis A vaccine.** / Consult your health care provider.  Hepatitis B vaccine.** / Consult your health care provider.  Haemophilus influenzae type b (Hib) vaccine.** / Consult your health care provider. Ages 25 to 80 years  Blood pressure check.** / Every 1 to 2 years.  Lipid and cholesterol check.** / Every 5 years beginning at age 91 years.  Lung cancer screening. / Every year if you are aged 24-80 years and have a 30-pack-year history of smoking and currently smoke or have quit within the past 15 years. Yearly screening is stopped once you have quit smoking for at least 15 years or develop a health problem that would prevent you from having lung cancer treatment.  Clinical breast exam.** / Every year after age 44 years.  BRCA-related cancer risk assessment.** / For women who have family members with a BRCA-related cancer (breast, ovarian, tubal, or peritoneal cancers).  Mammogram.** / Every year beginning at age 64 years and continuing for as long as you are in good health. Consult with your health care provider.  Pap test.** / Every 3 years starting at age 71 years through age 81 or 55 years with a history of 3 consecutive normal Pap tests.  HPV screening.** / Every 3 years from ages 49 years through ages 83 to 52 years with a history of 3 consecutive normal Pap tests.  Fecal occult blood test (FOBT) of stool. / Every year beginning at age 82 years and continuing until age 88 years. You may not need to do this test if you get a colonoscopy every 10 years.  Flexible sigmoidoscopy or colonoscopy.** / Every 5 years for a flexible sigmoidoscopy or every 10 years for a colonoscopy beginning at age 85 years  and continuing until age 63 years.  Hepatitis C blood test.** / For all people born from [birth year] through 1964 and any individual with known risks for hepatitis C.  Skin self-exam. / Monthly.  Influenza vaccine. / Every year.  Tetanus, diphtheria, and acellular pertussis (Tdap/Td) vaccine.** / Consult your health care provider. Pregnant women should receive 1 dose of Tdap vaccine during each pregnancy. 1 dose of Td every 10 years.  Varicella vaccine.** / Consult your health care provider. Pregnant females who do not have evidence of immunity should receive the first dose after pregnancy.  Zoster vaccine.** / 1 dose for adults aged 59 years or older.  Measles, mumps, rubella (MMR) vaccine.** / You need at least 1 dose of MMR if you were born in 1957 or later. You may also need a 2nd dose. For females of childbearing age, rubella immunity should be determined. If there is no evidence of immunity, females who are not pregnant should be vaccinated. If there is no evidence of immunity, females who are pregnant should delay immunization until after pregnancy.  Pneumococcal 13-valent conjugate (PCV13) vaccine.** / Consult your health care provider.  Pneumococcal polysaccharide (PPSV23) vaccine.** / 1 to 2 doses if you smoke cigarettes or if you have certain conditions.  Meningococcal vaccine.** / Consult your health care provider.  Hepatitis A vaccine.** / Consult your health care provider.  Hepatitis B vaccine.** / Consult your health care  provider.  Haemophilus influenzae type b (Hib) vaccine.** / Consult your health care provider. Ages 82 years and over  Blood pressure check.** / Every 1 to 2 years.  Lipid and cholesterol check.** / Every 5 years beginning at age 71 years.  Lung cancer screening. / Every year if you are aged 67-80 years and have a 30-pack-year history of smoking and currently smoke or have quit within the past 15 years. Yearly screening is stopped once you have quit smoking  for at least 15 years or develop a health problem that would prevent you from having lung cancer treatment.  Clinical breast exam.** / Every year after age 64 years.  BRCA-related cancer risk assessment.** / For women who have family members with a BRCA-related cancer (breast, ovarian, tubal, or peritoneal cancers).  Mammogram.** / Every year beginning at age 74 years and continuing for as long as you are in good health. Consult with your health care provider.  Pap test.** / Every 3 years starting at age 45 years through age 63 or 57 years with 3 consecutive normal Pap tests. Testing can be stopped between 65 and 70 years with 3 consecutive normal Pap tests and no abnormal Pap or HPV tests in the past 10 years.  HPV screening.** / Every 3 years from ages 17 years through ages 63 or 64 years with a history of 3 consecutive normal Pap tests. Testing can be stopped between 65 and 70 years with 3 consecutive normal Pap tests and no abnormal Pap or HPV tests in the past 10 years.  Fecal occult blood test (FOBT) of stool. / Every year beginning at age 12 years and continuing until age 28 years. You may not need to do this test if you get a colonoscopy every 10 years.  Flexible sigmoidoscopy or colonoscopy.** / Every 5 years for a flexible sigmoidoscopy or every 10 years for a colonoscopy beginning at age 33 years and continuing until age 37 years.  Hepatitis C blood test.** / For all people born from [birth year] through 1964 and any individual with known risks for hepatitis C.  Osteoporosis screening.** / A one-time screening for women ages 75 years and over and women at risk for fractures or osteoporosis.  Skin self-exam. / Monthly.  Influenza vaccine. / Every year.  Tetanus, diphtheria, and acellular pertussis (Tdap/Td) vaccine.** / 1 dose of Td every 10 years.  Varicella vaccine.** / Consult your health care provider.  Zoster vaccine.** / 1 dose for adults aged 71 years or older.  Pneumococcal  13-valent conjugate (PCV13) vaccine.** / Consult your health care provider.  Pneumococcal polysaccharide (PPSV23) vaccine.** / 1 dose for all adults aged 74 years and older.  Meningococcal vaccine.** / Consult your health care provider.  Hepatitis A vaccine.** / Consult your health care provider.  Hepatitis B vaccine.** / Consult your health care provider.  Haemophilus influenzae type b (Hib) vaccine.** / Consult your health care provider. ** Family history and personal history of risk and conditions may change your health care provider's recommendations. Document Released: 02/19/2001 Document Revised: 05/10/2013 Document Reviewed: 05/21/2010 [facility] Patient Information 2015 [place], [place]. This information is not intended to replace advice given to you by your health care provider. Make sure you discuss any questions you have with your health care provider. Thank you for enrolling in [facility]. Please follow the instructions below to securely access your online medical record. MyChart allows you to send messages to your doctor, view your test results, manage appointments, and more.   How  Do I Sign Up? 1. In your Internet browser, go to [employer] and enter [email or web address]. 2. Click on the Sign Up Now link in the Sign In box. You will see the New Member Sign Up page. 3. Enter your MyChart Access Code exactly as it appears below. You will not need to use this code after you've completed the sign-up process. If you do not sign up before the expiration date, you must request a new code.  MyChart Access Code: [email or web address] Expires: 12/18/2013  8:55 AM  4. Enter your Social Security Number ([id number]) and Date of Birth (mm/dd/yyyy) as indicated and click Submit. You will be taken to the next sign-up page. 5. Create a MyChart ID. This will be your MyChart login ID and cannot be changed, so think of one that is secure and easy to remember. 6. Create a MyChart password.  You can change your password at any time. 7. Enter your Password Reset Question and Answer. This can be used at a later time if you forget your password.  8. Enter your e-mail address. You will receive e-mail notification when new information is available in [place]. 9. Click Sign Up. You can now view your medical record.   Additional Information Remember, MyChart is NOT to be used for urgent needs. For medical emergencies, dial 911.

## 2013-10-19 NOTE — Progress Notes (Signed)
    GYNECOLOGY CLINIC ANNUAL PREVENTATIVE CARE ENCOUNTER NOTE  Subjective:     [name] is a 48 y.o. 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  Current complaints: none. Needs refills of OCPs. No problems with sexual intercourse.   Gynecologic History No LMP recorded. Patient is not currently having periods (Reason: Oral contraceptives). Contraception: OCP (estrogen/progesterone) Last Pap: HGSIL with LEEP in 01/20/13 showing CIN III with positive endocervical margins Last mammogram: 03/23/13. Results were: normal  Obstetric History OB History  Gravida Para Term Preterm AB SAB TAB Ectopic Multiple Living  1 1        1     # Outcome Date GA Lbr Len/2nd Weight Sex Delivery Anes PTL Lv  1 PAR     F CCS   Y     The following portions of the patient's history were reviewed and updated as appropriate: allergies, current medications, past family history, past medical history, past social history, past surgical history and problem list.  Review of Systems Pertinent items are noted in HPI.    Objective:   BP 120/81  Pulse 74  Ht 5\' 8"  (1.727 m)  Wt 250 lb 9.6 oz (113.671 kg)  BMI 38.11 kg/m2 GENERAL: Well-developed, well-nourished female in no acute distress.  HEENT: Normocephalic, atraumatic. Sclerae anicteric.  NECK: Supple. Normal thyroid.  LUNGS: Clear to auscultation bilaterally.  HEART: Regular rate and rhythm. BREASTS: Symmetric in size. No masses, skin changes, nipple drainage, or lymphadenopathy. ABDOMEN: Soft, nontender, nondistended. No organomegaly. PELVIC: Normal external female genitalia. Vagina is pink and rugated.  Normal discharge. Normal cervix contour. Pap smear obtained. Uterus is normal in size. No adnexal mass or tenderness.  EXTREMITIES: No cyanosis, clubbing, or edema, 2+ distal pulses.   Assessment:   Annual gynecologic  examination Surveillance after LEEP that showed CIN III with positive endocervical margins OCP surveillance   Plan:   Pap done, will follow up results and manage accordingly. Mammogram up to date OCPs refilled Routine preventative health maintenance measures emphasized   Verita Schneiders, MD, FACOG Attending [place] for [place], [place]

## 2013-10-21 LAB — CYTOLOGY - PAP

## 2013-11-08 ENCOUNTER — Encounter: Payer: Self-pay | Admitting: Obstetrics & Gynecology

## 2014-11-30 ENCOUNTER — Telehealth: Payer: Self-pay | Admitting: Obstetrics & Gynecology

## 2014-11-30 ENCOUNTER — Ambulatory Visit (INDEPENDENT_AMBULATORY_CARE_PROVIDER_SITE_OTHER): Payer: 59 | Admitting: Obstetrics & Gynecology

## 2014-11-30 ENCOUNTER — Encounter: Payer: Self-pay | Admitting: Obstetrics & Gynecology

## 2014-11-30 VITALS — BP 115/80 | HR 89 | Resp 20 | Ht 68.0 in | Wt 251.0 lb

## 2014-11-30 DIAGNOSIS — Z01419 Encounter for gynecological examination (general) (routine) without abnormal findings: Secondary | ICD-10-CM | POA: Diagnosis not present

## 2014-11-30 DIAGNOSIS — Z1151 Encounter for screening for human papillomavirus (HPV): Secondary | ICD-10-CM

## 2014-11-30 DIAGNOSIS — Z803 Family history of malignant neoplasm of breast: Secondary | ICD-10-CM

## 2014-11-30 DIAGNOSIS — Z3041 Encounter for surveillance of contraceptive pills: Secondary | ICD-10-CM

## 2014-11-30 DIAGNOSIS — Z124 Encounter for screening for malignant neoplasm of cervix: Secondary | ICD-10-CM | POA: Diagnosis not present

## 2014-11-30 DIAGNOSIS — Z1231 Encounter for screening mammogram for malignant neoplasm of breast: Secondary | ICD-10-CM | POA: Diagnosis not present

## 2014-11-30 DIAGNOSIS — D069 Carcinoma in situ of cervix, unspecified: Secondary | ICD-10-CM | POA: Diagnosis not present

## 2014-11-30 MED ORDER — LEVONORGEST-ETH ESTRAD 91-DAY 0.15-0.03 &0.01 MG PO TABS: 1.0000 | ORAL_TABLET | Freq: Every day | ORAL | Status: DC

## 2014-11-30 NOTE — Telephone Encounter (Signed)
Contacted patient about an order that was put in today for a mammogram. No answer, left message instructing her to call the office so we could get her schedule for that procedure.

## 2014-11-30 NOTE — Progress Notes (Signed)
GYNECOLOGY CLINIC ANNUAL PREVENTATIVE CARE ENCOUNTER NOTE  Subjective:   [name] is a 49 y.o. G1P1 female here for a routine annual gynecologic exam.  Current complaints: none.   Denies abnormal vaginal bleeding, discharge, pelvic pain, problems with intercourse or other gynecologic concerns.    Gynecologic History No LMP recorded. Patient is not currently having periods (Reason: Oral contraceptives). Contraception: OCP (estrogen/progesterone) Last Pap: 10/22/12 HGSIL and colposcopy showed CIN III. LEEP pathology 01/20/2013, clear ectocervical margins, positive endocervical margin.  10/19/2013  Normal cytology, negative HRHPV Last mammogram: 03/24/2013. Results were: normal  Obstetric History OB History  Gravida Para Term Preterm AB SAB TAB Ectopic Multiple Living  1 1        1     # Outcome Date GA Lbr Len/2nd Weight Sex Delivery Anes PTL Lv  1 Para     F CS-Classical   Y      Past Medical History  Diagnosis Date  . History of abnormal Pap smear     Past Surgical History  Procedure Laterality Date  . Cesarean section    . Breast lumpectomy      No current outpatient prescriptions on file prior to visit.   No current facility-administered medications on file prior to visit.    Allergies  Allergen Reactions  . Latex     sensitive    Social History   Social History  . Marital Status: Single    Spouse Name: N/A  . Number of Children: N/A  . Years of Education: N/A   Occupational History  . Not on file.   Social History Main Topics  . Smoking status: Never Smoker   . Smokeless tobacco: Never Used  . Alcohol Use: No  . Drug Use: No  . Sexual Activity: No   Other Topics Concern  . Not on file   Social History Narrative    Family History  Problem Relation Age of Onset  . Breast cancer Paternal Grandmother 55    deceased at 50  . Diabetes Maternal Grandfather   . Hypertension Father   . Colon polyps Father   . Diabetes Mother   .  Diverticulitis Mother   . Breast cancer Paternal Aunt     Paternal/unsure of age  . Breast cancer Cousin 52     3 paternal cousin  . Breast cancer Cousin 82    paternal  . Breast cancer Cousin     paternal  . Cancer Mother     uterine    The following portions of the patient's history were reviewed and updated as appropriate: allergies, current medications, past family history, past medical history, past social history, past surgical history and problem list.  Review of Systems Pertinent items noted in HPI and remainder of comprehensive ROS otherwise negative.   Objective:  BP 115/80 mmHg  Pulse 89  Resp 20  Ht 5\' 8"  (1.727 m)  Wt 251 lb (113.853 kg)  BMI 38.17 kg/m2 CONSTITUTIONAL: Well-developed, well-nourished female in no acute distress.  HENT:  Normocephalic, atraumatic, External right and left ear normal. Oropharynx is clear and moist EYES: Conjunctivae and EOM are normal. Pupils are equal, round, and reactive to light. No scleral icterus.  NECK: Normal range of motion, supple, no masses.  Normal thyroid.  SKIN: Skin is warm and dry. No rash noted. Not diaphoretic. No erythema. No pallor. [place]: Alert and oriented to person, place, and time. Normal reflexes, muscle tone coordination. No cranial nerve deficit noted. PSYCHIATRIC: Normal mood  and affect. Normal behavior. Normal judgment and thought content. CARDIOVASCULAR: Normal heart rate noted, regular rhythm RESPIRATORY: Clear to auscultation bilaterally. Effort and breath sounds normal, no problems with respiration noted. BREASTS: Symmetric in size. No masses, skin changes, nipple drainage, or lymphadenopathy. ABDOMEN: Soft, normal bowel sounds, no distention noted.  No tenderness, rebound or guarding.  PELVIC: Normal appearing external genitalia; normal appearing vaginal mucosa and cervix.  No abnormal discharge noted.  Pap smear obtained.  Normal uterine size, no other palpable masses, no uterine or adnexal  tenderness. MUSCULOSKELETAL: Normal range of motion. No tenderness.  No cyanosis, clubbing, or edema.  2+ distal pulses.   Assessment and Plan:  Annual gynecologic examination with pap smear 1. Encounter for gynecological examination with Papanicolaou smear of cervix 2. 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 Visit for screening mammogram 4. FH: breast cancer - MM DIGITAL SCREENING BILATERAL; Future  5. Encounter for surveillance of contraceptive pills Satisfied with method - Levonorgestrel-Ethinyl Estradiol (SEASONIQUE) 0.15-0.03 &0.01 MG tablet; Take 1 tablet by mouth daily.  Dispense: 1 Package; Refill: 5  Routine preventative health maintenance measures emphasized. Please refer to After Visit Summary for other counseling recommendations.    Verita Schneiders, MD, [place] Attending Obstetrician & Gynecologist, [place] for [facility]

## 2014-12-05 LAB — CYTOLOGY - PAP

## 2014-12-07 ENCOUNTER — Telehealth: Payer: Self-pay

## 2014-12-07 NOTE — Telephone Encounter (Signed)
Called to try and schedule an appt for a mammogram, no answer, left message instructing her to return my call here at the office.

## 2014-12-12 ENCOUNTER — Telehealth: Payer: Self-pay

## 2014-12-12 NOTE — Telephone Encounter (Signed)
Following up from her appt on 11/30/2014. Md wanted her to have a mammogram, called her to attempt to schedule this, no answer, left voicemail, instructing her to return my call here at the office

## 2014-12-29 ENCOUNTER — Ambulatory Visit
Admission: RE | Admit: 2014-12-29 | Discharge: 2014-12-29 | Disposition: A | Discharge status: Home / Self Care | Payer: 59 | Source: Ambulatory Visit | Attending: Obstetrics & Gynecology | Admitting: Obstetrics & Gynecology

## 2014-12-29 DIAGNOSIS — Z803 Family history of malignant neoplasm of breast: Secondary | ICD-10-CM

## 2014-12-29 DIAGNOSIS — Z1231 Encounter for screening mammogram for malignant neoplasm of breast: Secondary | ICD-10-CM

## 2015-05-02 DIAGNOSIS — M222X2 Patellofemoral disorders, left knee: Secondary | ICD-10-CM | POA: Diagnosis not present

## 2015-05-02 DIAGNOSIS — M222X1 Patellofemoral disorders, right knee: Secondary | ICD-10-CM | POA: Diagnosis not present

## 2015-05-15 DIAGNOSIS — M2241 Chondromalacia patellae, right knee: Secondary | ICD-10-CM | POA: Diagnosis not present

## 2015-05-15 DIAGNOSIS — M2242 Chondromalacia patellae, left knee: Secondary | ICD-10-CM | POA: Diagnosis not present

## 2015-05-15 DIAGNOSIS — M25561 Pain in right knee: Secondary | ICD-10-CM | POA: Diagnosis not present

## 2015-05-15 DIAGNOSIS — M25562 Pain in left knee: Secondary | ICD-10-CM | POA: Diagnosis not present

## 2015-05-22 DIAGNOSIS — M2242 Chondromalacia patellae, left knee: Secondary | ICD-10-CM | POA: Diagnosis not present

## 2015-05-22 DIAGNOSIS — M25562 Pain in left knee: Secondary | ICD-10-CM | POA: Diagnosis not present

## 2015-05-22 DIAGNOSIS — M2241 Chondromalacia patellae, right knee: Secondary | ICD-10-CM | POA: Diagnosis not present

## 2015-05-22 DIAGNOSIS — M25561 Pain in right knee: Secondary | ICD-10-CM | POA: Diagnosis not present

## 2015-05-30 DIAGNOSIS — M25561 Pain in right knee: Secondary | ICD-10-CM | POA: Diagnosis not present

## 2015-05-30 DIAGNOSIS — M222X1 Patellofemoral disorders, right knee: Secondary | ICD-10-CM | POA: Diagnosis not present

## 2015-07-04 DIAGNOSIS — M25562 Pain in left knee: Secondary | ICD-10-CM | POA: Diagnosis not present

## 2015-08-04 ENCOUNTER — Encounter: Payer: Self-pay | Admitting: Dietician

## 2015-08-04 ENCOUNTER — Encounter: Payer: 59 | Attending: Family Medicine | Admitting: Dietician

## 2015-08-04 DIAGNOSIS — Z713 Dietary counseling and surveillance: Secondary | ICD-10-CM | POA: Insufficient documentation

## 2015-08-04 DIAGNOSIS — Z91018 Allergy to other foods: Secondary | ICD-10-CM

## 2015-08-04 DIAGNOSIS — E669 Obesity, unspecified: Secondary | ICD-10-CM

## 2015-08-04 NOTE — Patient Instructions (Addendum)
Stay very active.  Aim for 1 hour most days. Consider multi vitamin. Consider vitamin D 2000 units daily. Don't skip meals. Increase your variety Sources of whole grains  [place]  Quinoa  Barley   Brown Rice Mindful eating, eat slowly, stop when when you are satisfied. Snack:  Ask am I hungry?   Avoid eating in front of the TV or computer.

## 2015-08-04 NOTE — Progress Notes (Signed)
  Medical Nutrition Therapy:  Appt start time: 1030 end time:  1130.   Assessment:  Primary concerns today: Patient is here alone.  She would like to lose weight.  Her knees have been hurting.  She has never intentionally lost weight but did lose weight in the past by playing tennis.    She lives alone most of the time but her daughter is home from college currently.  She is originally from [place].  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  Her diet lacks significant variety due to food allergies and texture aversions.    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 Highest adult weight:  249 lbs currently  Preferred Learning Style:   No preference indicated   Learning Readiness:   Ready  Change in progress  MEDICATIONS: seeist   DIETARY INTAKE: Mainly eats clean. Usual eating pattern includes 2-3 meals and 1-2 snacks per day.  Everyday foods include cream of wheat, chicken, greens, and broccoli.  Avoided foods include shellfish, nuts, pineapple, berries, mango, cantaloupe, coconut, mushrooms allergies and texture issue with beans, quinoa, sweet potatoes, yogurt.  24-hr recall:  B ( AM): Cream of wheat, 2 strips bacon "for protein"  OR eggs and sausage Snk ( AM): occasional apple or grapes L (1-3PM): leftovers (sping mix with dressing, chicken, and broccoli) Snk ( PM): usually none D ( PM): chicken or steak, rice, broccoli or skips Snk ( PM):  Ice cream or pretzels ('but not hungry- just watching TV") or eats this if she skips dinner Beverages: water, sweet tea (1 cup sugar in 2 quarts)  Usual physical activity: 3-4 times per week at the gym, cardio and weight training and on the arch trainer.  She goes after work which makes dinner late or then  skips.  Water aerobics on Saturday.  Estimated energy needs: 1500 calories 100 g protein  Progress Towards Goal(s):  In progress.   Nutritional Diagnosis:  NI-5.5 Imbalance of nutrients As related to food allergies and texture aversion.  As evidenced by diet hx and patient report.    Intervention:  Nutrition /counseling education on importance of a diet varied in nutrients, regular meal schedule and exercise.  Stay very active.  Aim for 1 hour most days. Consider multi vitamin. Consider vitamin D 2000 units daily. Don't skip meals. Increase your variety Sources of whole grains  [place]  Quinoa  Barley   Brown Rice Mindful eating, eat slowly, stop when when you are satisfied. Snack:  Ask am I hungry?   Avoid eating in front of the TV or computer.  Teaching Method Utilized:  Visual Auditory Hands on  Handouts given during visit include:  none  Barriers to learning/adherence to lifestyle change: none  Demonstrated degree of understanding via:  Teach Back   Monitoring/Evaluation:  Dietary intake, exercise, and body weight prn.

## 2015-11-28 DIAGNOSIS — H5213 Myopia, bilateral: Secondary | ICD-10-CM | POA: Diagnosis not present

## 2015-11-28 DIAGNOSIS — H524 Presbyopia: Secondary | ICD-10-CM | POA: Diagnosis not present

## 2015-11-28 DIAGNOSIS — H52223 Regular astigmatism, bilateral: Secondary | ICD-10-CM | POA: Diagnosis not present

## 2015-12-04 ENCOUNTER — Ambulatory Visit (INDEPENDENT_AMBULATORY_CARE_PROVIDER_SITE_OTHER): Payer: 59 | Admitting: Obstetrics & Gynecology

## 2015-12-04 ENCOUNTER — Encounter: Payer: Self-pay | Admitting: Obstetrics & Gynecology

## 2015-12-04 VITALS — BP 114/76 | HR 51 | Resp 18 | Ht 68.0 in | Wt 248.0 lb

## 2015-12-04 DIAGNOSIS — Z124 Encounter for screening for malignant neoplasm of cervix: Secondary | ICD-10-CM

## 2015-12-04 DIAGNOSIS — Z1151 Encounter for screening for human papillomavirus (HPV): Secondary | ICD-10-CM | POA: Diagnosis not present

## 2015-12-04 DIAGNOSIS — Z803 Family history of malignant neoplasm of breast: Secondary | ICD-10-CM | POA: Diagnosis not present

## 2015-12-04 DIAGNOSIS — Z8049 Family history of malignant neoplasm of other genital organs: Secondary | ICD-10-CM | POA: Diagnosis not present

## 2015-12-04 DIAGNOSIS — Z3041 Encounter for surveillance of contraceptive pills: Secondary | ICD-10-CM | POA: Diagnosis not present

## 2015-12-04 DIAGNOSIS — Z01419 Encounter for gynecological examination (general) (routine) without abnormal findings: Secondary | ICD-10-CM | POA: Diagnosis not present

## 2015-12-04 MED ORDER — LEVONORGEST-ETH ESTRAD 91-DAY 0.15-0.03 &0.01 MG PO TABS: 1.0000 | ORAL_TABLET | Freq: Every day | ORAL | 5 refills | Status: DC

## 2015-12-04 NOTE — Progress Notes (Signed)
GYNECOLOGY ANNUAL PREVENTATIVE CARE ENCOUNTER NOTE  Subjective:   [name] is a 50 y.o. G1P1 female here for a routine annual gynecologic exam.  Current complaints: none.  Desires testing for BRCA due to [place] of breast cancer.  Denies abnormal vaginal bleeding, discharge, pelvic pain, problems with intercourse or other gynecologic concerns.    Gynecologic History No LMP recorded. Patient is not currently having periods (Reason: Oral contraceptives). Contraception: OCP (estrogen/progesterone) Last Pap: 11/30/14 Normal cytology, negative HRHPV  10/19/2013  Normal cytology, negative HRHPV 10/22/12 HGSIL and colposcopy showed CIN III. LEEP pathology 01/20/2013, clear ectocervical margins, positive endocervical margin.   Last mammogram: 12/29/14. Results were: normal  Obstetric History OB History  Gravida Para Term Preterm AB Living  1 1       1   SAB TAB Ectopic Multiple Live Births          1    # Outcome Date GA Lbr Len/2nd Weight Sex Delivery Anes PTL Lv  1 Para     F CS-Classical   LIV      Past Medical History:  Diagnosis Date  . History of abnormal Pap smear     Past Surgical History:  Procedure Laterality Date  . BREAST LUMPECTOMY    . CESAREAN SECTION      No current outpatient prescriptions on file prior to visit.   No current facility-administered medications on file prior to visit.     Allergies  Allergen Reactions  . Latex     sensitive  . Shellfish Allergy Hives    Social History   Social History  . Marital status: Single    Spouse name: N/A  . Number of children: N/A  . Years of education: N/A   Occupational History  . Not on file.   Social History Main Topics  . Smoking status: Never Smoker  . Smokeless tobacco: Never Used  . Alcohol use No  . Drug use: No  . Sexual activity: Not Currently    Birth control/ protection: Pill   Other Topics Concern  . Not on file   Social History Narrative  . No narrative on file     Family History  Problem Relation Age of Onset  . Breast cancer Paternal Grandmother 29    deceased at 20  . Breast cancer Paternal Aunt     Paternal/unsure of age  . Breast cancer Cousin 37     3 paternal cousin  . Breast cancer Cousin 44    paternal  . Breast cancer Cousin     paternal  . Diabetes Maternal Grandfather   . Hypertension Father   . Colon polyps Father   . Diabetes Mother   . Diverticulitis Mother   . Cancer Mother     uterine    The following portions of the patient's history were reviewed and updated as appropriate: allergies, current medications, past family history, past medical history, past social history, past surgical history and problem list.  Review of Systems Pertinent items noted in HPI and remainder of comprehensive ROS otherwise negative.   Objective:  BP 114/76 (BP Location: Left Arm, Patient Position: Sitting, Cuff Size: Large)   Pulse (!) 51   Resp 18   Ht 5' 8"  (1.727 m)   Wt 248 lb (112.5 kg)   BMI 37.71 kg/m  CONSTITUTIONAL: Well-developed, well-nourished female in no acute distress.  HENT:  Normocephalic, atraumatic, External right and left ear normal. Oropharynx is clear and moist EYES: Conjunctivae and EOM  are normal. Pupils are equal, round, and reactive to light. No scleral icterus.  NECK: Normal range of motion, supple, no masses.  Normal thyroid.  SKIN: Skin is warm and dry. No rash noted. Not diaphoretic. No erythema. No pallor. NEUROLOGIC: Alert and oriented to person, place, and time. Normal reflexes, muscle tone coordination. No cranial nerve deficit noted. PSYCHIATRIC: Normal mood and affect. Normal behavior. Normal judgment and thought content. CARDIOVASCULAR: Normal heart rate noted, regular rhythm RESPIRATORY: Clear to auscultation bilaterally. Effort and breath sounds normal, no problems with respiration noted. BREASTS: Symmetric in size. No masses, skin changes, nipple drainage, or lymphadenopathy. ABDOMEN: Soft,  normal bowel sounds, no distention noted.  No tenderness, rebound or guarding.  PELVIC: Normal appearing external genitalia; normal appearing vaginal mucosa and cervix.  No abnormal discharge noted.  Pap smear obtained.  Normal uterine size, no other palpable masses, no uterine or adnexal tenderness. MUSCULOSKELETAL: Normal range of motion. No tenderness.  No cyanosis, clubbing, or edema.  2+ distal pulses.   Assessment:  Annual gynecologic examination with pap smear Desires BRCA testing   Plan:  Will follow up results of pap smear and manage accordingly. Mammogram to be scheduled; BRCA testing to be done today (counseling done today). Will follow up results and manage accordingly. Routine preventative health maintenance measures emphasized. Please refer to After Visit Summary for other counseling recommendations.    Verita Schneiders, MD, [place] Attending Obstetrician & Gynecologist, [place] for [facility]

## 2015-12-04 NOTE — Patient Instructions (Signed)

## 2015-12-06 ENCOUNTER — Encounter: Payer: Self-pay | Admitting: *Deleted

## 2015-12-08 LAB — CYTOLOGY - PAP
Diagnosis: NEGATIVE
HPV (WINDOPATH): NOT DETECTED

## 2016-01-16 DIAGNOSIS — J209 Acute bronchitis, unspecified: Secondary | ICD-10-CM | POA: Diagnosis not present

## 2016-01-16 DIAGNOSIS — J329 Chronic sinusitis, unspecified: Secondary | ICD-10-CM | POA: Diagnosis not present

## 2016-03-28 DIAGNOSIS — H6000 Abscess of external ear, unspecified ear: Secondary | ICD-10-CM | POA: Insufficient documentation

## 2016-04-24 DIAGNOSIS — H6000 Abscess of external ear, unspecified ear: Secondary | ICD-10-CM | POA: Diagnosis not present

## 2016-05-28 ENCOUNTER — Other Ambulatory Visit: Payer: Self-pay | Admitting: Obstetrics & Gynecology

## 2016-05-28 DIAGNOSIS — Z1231 Encounter for screening mammogram for malignant neoplasm of breast: Secondary | ICD-10-CM

## 2016-06-11 ENCOUNTER — Ambulatory Visit
Admission: RE | Admit: 2016-06-11 | Discharge: 2016-06-11 | Disposition: A | Discharge status: Home / Self Care | Payer: 59 | Source: Ambulatory Visit | Attending: Obstetrics & Gynecology | Admitting: Obstetrics & Gynecology

## 2016-06-11 DIAGNOSIS — Z1231 Encounter for screening mammogram for malignant neoplasm of breast: Secondary | ICD-10-CM | POA: Diagnosis not present

## 2016-12-23 ENCOUNTER — Other Ambulatory Visit: Payer: Self-pay

## 2016-12-23 DIAGNOSIS — Z3041 Encounter for surveillance of contraceptive pills: Secondary | ICD-10-CM

## 2016-12-23 MED ORDER — LEVONORGEST-ETH ESTRAD 91-DAY 0.15-0.03 &0.01 MG PO TABS: 1.0000 | ORAL_TABLET | Freq: Every day | ORAL | 5 refills | Status: DC

## 2016-12-23 NOTE — Telephone Encounter (Signed)
Patient called requesting a refill on birth control pills.

## 2017-01-23 DIAGNOSIS — H5213 Myopia, bilateral: Secondary | ICD-10-CM | POA: Diagnosis not present

## 2017-07-01 ENCOUNTER — Encounter: Payer: Self-pay | Admitting: Internal Medicine

## 2017-07-01 ENCOUNTER — Ambulatory Visit: Payer: 59 | Admitting: Internal Medicine

## 2017-07-01 VITALS — BP 102/62 | HR 60 | Temp 98.1°F | Ht 68.0 in | Wt 247.1 lb

## 2017-07-01 DIAGNOSIS — Z0000 Encounter for general adult medical examination without abnormal findings: Secondary | ICD-10-CM

## 2017-07-01 DIAGNOSIS — K5909 Other constipation: Secondary | ICD-10-CM | POA: Insufficient documentation

## 2017-07-01 DIAGNOSIS — Z91018 Allergy to other foods: Secondary | ICD-10-CM | POA: Diagnosis not present

## 2017-07-01 DIAGNOSIS — Z1211 Encounter for screening for malignant neoplasm of colon: Secondary | ICD-10-CM | POA: Diagnosis not present

## 2017-07-01 DIAGNOSIS — T782XXA Anaphylactic shock, unspecified, initial encounter: Secondary | ICD-10-CM | POA: Diagnosis not present

## 2017-07-01 DIAGNOSIS — E669 Obesity, unspecified: Secondary | ICD-10-CM

## 2017-07-01 DIAGNOSIS — Z1231 Encounter for screening mammogram for malignant neoplasm of breast: Secondary | ICD-10-CM | POA: Diagnosis not present

## 2017-07-01 MED ORDER — EPINEPHRINE 0.3 MG/0.3ML IJ SOAJ: 0.3000 mg | Freq: Once | INTRAMUSCULAR | 2 refills | Status: AC

## 2017-07-01 NOTE — Patient Instructions (Addendum)
Talk to GI about chronic constipation  Start with linzess 72 mg daily then 145 daily then 290  Check date on Tdap  Ask ob/gyn about other genetic testing I.e my myriad for breast cancer  F/u in 3 months   Linaclotide oral capsules What is this medicine? LINACLOTIDE (lin a KLOE tide) is used to treat irritable bowel syndrome (IBS) with constipation as the main problem. It may also be used for relief of chronic constipation. This medicine may be used for other purposes; ask your health care provider or pharmacist if you have questions. COMMON BRAND NAME(S): Linzess What should I tell my health care provider before I take this medicine? They need to know if you have any of these conditions: -history of stool (fecal) impaction -now have diarrhea or have diarrhea often -other medical condition -stomach or intestinal disease, including bowel obstruction or abdominal adhesions -an unusual or allergic reaction to linaclotide, other medicines, foods, dyes, or preservatives -pregnant or trying to get pregnant -breast-feeding How should I use this medicine? Take this medicine by mouth with a glass of water. Follow the directions on the prescription label. Do not cut, crush or chew this medicine. Take on an empty stomach, at least 30 minutes before your first meal of the day. Take your medicine at regular intervals. Do not take your medicine more often than directed. Do not stop taking except on your doctor's advice. A special MedGuide will be given to you by the pharmacist with each prescription and refill. Be sure to read this information carefully each time. Talk to your pediatrician regarding the use of this medicine in children. This medicine is not approved for use in children. Overdosage: If you think you have taken too much of this medicine contact a poison control center or emergency room at once. NOTE: This medicine is only for you. Do not share this medicine with others. What if I miss a  dose? If you miss a dose, just skip that dose. Wait until your next dose, and take only that dose. Do not take double or extra doses. What may interact with this medicine? -certain medicines for bowel problems or bladder incontinence (these can cause constipation) This list may not describe all possible interactions. Give your health care provider a list of all the medicines, herbs, non-prescription drugs, or dietary supplements you use. Also tell them if you smoke, drink alcohol, or use illegal drugs. Some items may interact with your medicine. What should I watch for while using this medicine? Visit your doctor for regular check ups. Tell your doctor if your symptoms do not get better or if they get worse. Diarrhea is a common side effect of this medicine. It often begins within 2 weeks of starting this medicine. Stop taking this medicine and call your doctor if you get severe diarrhea. Stop taking this medicine and call your doctor or go to the nearest hospital emergency room right away if you develop unusual or severe stomach-area (abdominal) pain, especially if you also have bright red, bloody stools or black stools that look like tar. What side effects may I notice from receiving this medicine? Side effects that you should report to your doctor or health care professional as soon as possible: -allergic reactions like skin rash, itching or hives, swelling of the face, lips, or tongue -black, tarry stools -bloody or watery diarrhea -new or worsening stomach pain -severe or prolonged diarrhea Side effects that usually do not require medical attention (report to your doctor or health  care professional if they continue or are bothersome): -bloating -gas -loose stools This list may not describe all possible side effects. Call your doctor for medical advice about side effects. You may report side effects to FDA at 1-800-FDA-1088. Where should I keep my medicine? Keep out of the reach of  children. Store at room temperature between 20 and 25 degrees C (68 and 77 degrees F). Keep this medicine in the original container. Keep tightly closed in a dry place. Do not remove the desiccant packet from the bottle, it helps to protect your medicine from moisture. Throw away any unused medicine after the expiration date. NOTE: This sheet is a summary. It may not cover all possible information. If you have questions about this medicine, talk to your doctor, pharmacist, or health care provider.  2018 Elsevier/Gold Standard (2015-01-26 12:17:04)  Recombinant Zoster (Shingles) Vaccine, RZV: What You Need to Know 1. Why get vaccinated? Shingles (also called herpes zoster, or just zoster) is a painful skin rash, often with blisters. Shingles is caused by the varicella zoster virus, the same virus that causes chickenpox. After you have chickenpox, the virus stays in your body and can cause shingles later in life. You can't catch shingles from another person. However, a person who has never had chickenpox (or chickenpox vaccine) could get chickenpox from someone with shingles. A shingles rash usually appears on one side of the face or body and heals within 2 to 4 weeks. Its main symptom is pain, which can be severe. Other symptoms can include fever, headache, chills and upset stomach. Very rarely, a shingles infection can lead to pneumonia, hearing problems, blindness, brain inflammation (encephalitis), or death. For about 1 person in 5, severe pain can continue even long after the rash has cleared up. This long-lasting pain is called post-herpetic neuralgia (PHN). Shingles is far more common in people 77 years of age and older than in younger people, and the risk increases with age. It is also more common in people whose immune system is weakened because of a disease such as cancer, or by drugs such as steroids or chemotherapy. At least 1 million people a year in the [place] get shingles. 2.  Shingles vaccine (recombinant) Recombinant shingles vaccine was approved by FDA in 2017 for the prevention of shingles. In clinical trials, it was more than 90% effective in preventing shingles. It can also reduce the likelihood of PHN. Two doses, 2 to 6 months apart, are recommended for adults 8 and older. This vaccine is also recommended for people who have already gotten the live shingles vaccine (Zostavax). There is no live virus in this vaccine. 3. Some people should not get this vaccine Tell your vaccine provider if you:  Have any severe, life-threatening allergies. A person who has ever had a life-threatening allergic reaction after a dose of recombinant shingles vaccine, or has a severe allergy to any component of this vaccine, may be advised not to be vaccinated. Ask your health care provider if you want information about vaccine components.  Are pregnant or breastfeeding. There is not much information about use of recombinant shingles vaccine in pregnant or nursing women. Your healthcare provider might recommend delaying vaccination.  Are not feeling well. If you have a mild illness, such as a cold, you can probably get the vaccine today. If you are moderately or severely ill, you should probably wait until you recover. Your doctor can advise you.  4. Risks of a vaccine reaction With any medicine, including vaccines,  there is a chance of reactions. After recombinant shingles vaccination, a person might experience:  Pain, redness, soreness, or swelling at the site of the injection  Headache, muscle aches, fever, shivering, fatigue  In clinical trials, most people got a sore arm with mild or moderate pain after vaccination, and some also had redness and swelling where they got the shot. Some people felt tired, had muscle pain, a headache, shivering, fever, stomach pain, or nausea. About 1 out of 6 people who got recombinant zoster vaccine experienced side effects that prevented them  from doing regular activities. Symptoms went away on their own in about 2 to 3 days. Side effects were more common in younger people. You should still get the second dose of recombinant zoster vaccine even if you had one of these reactions after the first dose. Other things that could happen after this vaccine:  People sometimes faint after medical procedures, including vaccination. Sitting or lying down for about 15 minutes can help prevent fainting and injuries caused by a fall. Tell your provider if you feel dizzy or have vision changes or ringing in the ears.  Some people get shoulder pain that can be more severe and longer-lasting than routine soreness that can follow injections. This happens very rarely.  Any medication can cause a severe allergic reaction. Such reactions to a vaccine are estimated at about 1 in a million doses, and would happen within a few minutes to a few hours after the vaccination. As with any medicine, there is a very remote chance of a vaccine causing a serious injury or death. The safety of vaccines is always being monitored. For more information, visit: http://www.aguilar.org/ 5. What if there is a serious problem? What should I look for?  Look for anything that concerns you, such as signs of a severe allergic reaction, very high fever, or unusual behavior. Signs of a severe allergic reaction can include hives, swelling of the face and throat, difficulty breathing, a fast heartbeat, dizziness, and weakness. These would usually start a few minutes to a few hours after the vaccination. What should I do?  If you think it is a severe allergic reaction or other emergency that can't wait, call 9-1-1 and get to the nearest hospital. Otherwise, call your health care provider. Afterward, the reaction should be reported to the Vaccine Adverse Event Reporting System (VAERS). Your doctor should file this report, or you can do it yourself through the VAERS web site  atwww.vaers.https://www.bray.com/ by calling 531-218-6267. VAERS does not give medical advice. 6. How can I learn more?  Ask your healthcare provider. He or she can give you the vaccine package insert or suggest other sources of information.  Call your local or state health department.  Contact the Centers for Disease Control and Prevention (CDC): ? Call (406) 159-3433 (1-800-CDC-INFO) or ? Visit the CDC's website at http://hunter.com/ CDC Vaccine Information Statement (VIS) Recombinant Zoster Vaccine (02/19/2016) This information is not intended to replace advice given to you by your health care provider. Make sure you discuss any questions you have with your health care provider. Document Released: 03/05/2016 Document Revised: 03/05/2016 Document Reviewed: 03/05/2016 Elsevier Interactive Patient Education  2018 [employer].  Constipation, Adult Constipation is when a person has fewer bowel movements in a week than normal, has difficulty having a bowel movement, or has stools that are dry, hard, or larger than normal. Constipation may be caused by an underlying condition. It may become worse with age if a person takes certain medicines and  does not take in enough fluids. Follow these instructions at home: Eating and drinking   Eat foods that have a lot of fiber, such as fresh fruits and vegetables, whole grains, and beans.  Limit foods that are high in fat, low in fiber, or overly processed, such as french fries, hamburgers, cookies, candies, and soda.  Drink enough fluid to keep your urine clear or pale yellow. General instructions  Exercise regularly or as told by your health care provider.  Go to the restroom when you have the urge to go. Do not hold it in.  Take over-the-counter and prescription medicines only as told by your health care provider. These include any fiber supplements.  Practice pelvic floor retraining exercises, such as deep breathing while relaxing the lower abdomen  and pelvic floor relaxation during bowel movements.  Watch your condition for any changes.  Keep all follow-up visits as told by your health care provider. This is important. Contact a health care provider if:  You have pain that gets worse.  You have a fever.  You do not have a bowel movement after 4 days.  You vomit.  You are not hungry.  You lose weight.  You are bleeding from the anus.  You have thin, pencil-like stools. Get help right away if:  You have a fever and your symptoms suddenly get worse.  You leak stool or have blood in your stool.  Your abdomen is bloated.  You have severe pain in your abdomen.  You feel dizzy or you faint. This information is not intended to replace advice given to you by your health care provider. Make sure you discuss any questions you have with your health care provider. Document Released: 09/22/2003 Document Revised: 07/14/2015 Document Reviewed: 06/14/2015 Elsevier Interactive Patient Education  2018 [employer].

## 2017-07-01 NOTE — Progress Notes (Signed)
Pre visit review using our clinic review tool, if applicable. No additional management support is needed unless otherwise documented below in the visit note. 

## 2017-07-01 NOTE — Progress Notes (Signed)
Chief Complaint  Patient presents with  . Establish Care  . Annual Exam   New patient/annual  1. Obesity BMI 37.57 she is limited to exercise due to chronic knee pain and picky eater with food allergies  2. Agreeable to mammo and colonoscopy  3. H/o vitamin D def  4. C/o chronic constipation since child  Review of Systems  Constitutional: Negative for weight loss.  HENT: Negative for hearing loss.   Eyes: Negative for blurred vision.  Respiratory: Negative for shortness of breath.   Cardiovascular: Negative for chest pain.  Gastrointestinal: Positive for constipation. Negative for abdominal pain.  Musculoskeletal: Negative for falls.  Skin: Negative for rash.  Neurological: Negative for dizziness.  Psychiatric/Behavioral: Negative for depression.   Past Medical History:  Diagnosis Date  . History of abnormal Pap smear    Past Surgical History:  Procedure Laterality Date  . BREAST EXCISIONAL BIOPSY Left 2000   benign  . BREAST LUMPECTOMY    . CESAREAN SECTION     Family History  Problem Relation Age of Onset  . Breast cancer Paternal Grandmother 85       deceased at 61  . Breast cancer Paternal Aunt        Paternal/unsure of age  . Breast cancer Cousin 56        3 paternal cousin  . Breast cancer Cousin 68       paternal  . Breast cancer Cousin        paternal  . Diabetes Maternal Grandfather   . Hypertension Father   . Colon polyps Father   . Diabetes Mother   . Diverticulitis Mother   . Cancer Mother        uterine   Social History   Socioeconomic History  . Marital status: Single    Spouse name: Not on file  . Number of children: Not on file  . Years of education: Not on file  . Highest education level: Not on file  Occupational History  . Not on file  Social Needs  . Financial resource strain: Not on file  . Food insecurity:    Worry: Not on file    Inability: Not on file  . Transportation needs:    Medical: Not on file    Non-medical: Not on  file  Tobacco Use  . Smoking status: Never Smoker  . Smokeless tobacco: Never Used  Substance and Sexual Activity  . Alcohol use: No  . Drug use: No  . Sexual activity: Not Currently    Birth control/protection: Pill  Lifestyle  . Physical activity:    Days per week: Not on file    Minutes per session: Not on file  . Stress: Not on file  Relationships  . Social connections:    Talks on phone: Not on file    Gets together: Not on file    Attends religious service: Not on file    Active member of club or organization: Not on file    Attends meetings of clubs or organizations: Not on file    Relationship status: Not on file  . Intimate partner violence:    Fear of current or ex partner: Not on file    Emotionally abused: Not on file    Physically abused: Not on file    Forced sexual activity: Not on file  Other Topics Concern  . Not on file  Social History Narrative  . Not on file   No outpatient medications have been marked  as taking for the 07/01/17 encounter (Office Visit) with Hardy, Patricia Glow, MD.   Allergies  Allergen Reactions  . Latex     sensitive  . Shellfish Allergy Hives   No results found for this or any previous visit (from the past 2160 hour(s)). Objective  Body mass index is 37.57 kg/m. Wt Readings from Last 3 Encounters:  07/01/17 247 lb 1.6 oz (112.1 kg)  12/04/15 248 lb (112.5 kg)  08/04/15 249 lb (112.9 kg)   Temp Readings from Last 3 Encounters:  07/01/17 98.1 F (36.7 C) (Oral)   BP Readings from Last 3 Encounters:  07/01/17 102/62  12/04/15 114/76  11/30/14 115/80   Pulse Readings from Last 3 Encounters:  07/01/17 60  12/04/15 (!) 51  11/30/14 89    Physical Exam  Constitutional: She is oriented to person, place, and time. Vital signs are normal. She appears well-developed and well-nourished. She is cooperative.  HENT:  Head: Normocephalic and atraumatic.  Mouth/Throat: Oropharynx is clear and moist and mucous membranes  are normal.  Eyes: Pupils are equal, round, and reactive to light. Conjunctivae are normal.  Cardiovascular: Normal rate, regular rhythm and normal heart sounds.  Pulmonary/Chest: Effort normal and breath sounds normal.  Neurological: She is alert and oriented to person, place, and time. Gait normal.  Skin: Skin is warm, dry and intact.  Psychiatric: She has a normal mood and affect. Her speech is normal and behavior is normal. Judgment and thought content normal. Cognition and memory are normal.  Nursing note and vitals reviewed.   Assessment   1. H/o anaphylactic food allergies  2. Chronic constipation  3. Annual exam/HM Plan   1. Epi pen  Declines allergy testing for now for food  2. linzess sample 72, 145 and 290 given today to try 3 bottles total Also disc with GI  3. Fasting labs CMET, CBC, lipid, UA, TSH, T4, vit D at [name]  Declines STD check, MMR (immune per pt), hep B  Flu shot had 10/2016  Tdap ? 2016 not in [place] had with [facility] per pt  Given info about shingrix   Refer mammo [facility] [place] breast cancer BRCA neg disc other options gene testing with OB/GYN Referred colonoscopy Dr. Juanita Hardy and disc chronic constipation  Pap had 12/04/15 neg neg HPV h/o LEEP f/u OB/GYN   rec exercise and healthy eating habits   01/2017 eye exam [name]  [name]   Declines STD testing, MMR, hep b Provider: Dr. Olivia Mackie Hardy-Internal Medicine

## 2017-07-02 ENCOUNTER — Other Ambulatory Visit: Payer: Self-pay | Admitting: Internal Medicine

## 2017-07-02 DIAGNOSIS — Z0000 Encounter for general adult medical examination without abnormal findings: Secondary | ICD-10-CM | POA: Diagnosis not present

## 2017-07-02 DIAGNOSIS — Z1329 Encounter for screening for other suspected endocrine disorder: Secondary | ICD-10-CM | POA: Diagnosis not present

## 2017-07-02 DIAGNOSIS — Z1389 Encounter for screening for other disorder: Secondary | ICD-10-CM | POA: Diagnosis not present

## 2017-07-02 DIAGNOSIS — E559 Vitamin D deficiency, unspecified: Secondary | ICD-10-CM | POA: Diagnosis not present

## 2017-07-02 DIAGNOSIS — Z13220 Encounter for screening for lipoid disorders: Secondary | ICD-10-CM | POA: Diagnosis not present

## 2017-07-03 ENCOUNTER — Telehealth: Payer: Self-pay | Admitting: Internal Medicine

## 2017-07-03 LAB — URINALYSIS, ROUTINE W REFLEX MICROSCOPIC
BILIRUBIN UA: NEGATIVE
Glucose, UA: NEGATIVE
Ketones, UA: NEGATIVE
Leukocytes, UA: NEGATIVE
NITRITE UA: NEGATIVE
PH UA: 5 (ref 5.0–7.5)
PROTEIN UA: NEGATIVE
RBC UA: NEGATIVE
Specific Gravity, UA: 1.018 (ref 1.005–1.030)
UUROB: 0.2 mg/dL (ref 0.2–1.0)

## 2017-07-03 LAB — COMPREHENSIVE METABOLIC PANEL
A/G RATIO: 1.1 — AB (ref 1.2–2.2)
ALT: 33 IU/L — AB (ref 0–32)
AST: 28 IU/L (ref 0–40)
Albumin: 3.9 g/dL (ref 3.5–5.5)
Alkaline Phosphatase: 68 IU/L (ref 39–117)
BUN/Creatinine Ratio: 15 (ref 9–23)
BUN: 15 mg/dL (ref 6–24)
Bilirubin Total: 0.2 mg/dL (ref 0.0–1.2)
CALCIUM: 9.6 mg/dL (ref 8.7–10.2)
CO2: 19 mmol/L — AB (ref 20–29)
CREATININE: 1 mg/dL (ref 0.57–1.00)
Chloride: 107 mmol/L — ABNORMAL HIGH (ref 96–106)
GFR calc Af Amer: 75 mL/min/{1.73_m2} (ref >59)
GFR, EST NON AFRICAN AMERICAN: 65 mL/min/{1.73_m2} (ref >59)
Globulin, Total: 3.4 g/dL (ref 1.5–4.5)
Glucose: 94 mg/dL (ref 65–99)
POTASSIUM: 4.3 mmol/L (ref 3.5–5.2)
Sodium: 139 mmol/L (ref 134–144)
TOTAL PROTEIN: 7.3 g/dL (ref 6.0–8.5)

## 2017-07-03 LAB — CBC WITH DIFFERENTIAL/PLATELET
BASOS: 0 %
Basophils Absolute: 0 10*3/uL (ref 0.0–0.2)
EOS (ABSOLUTE): 0.1 10*3/uL (ref 0.0–0.4)
Eos: 2 %
Hematocrit: 38.8 % (ref 34.0–46.6)
Hemoglobin: 13.2 g/dL (ref 11.1–15.9)
IMMATURE GRANULOCYTES: 0 %
Immature Grans (Abs): 0 10*3/uL (ref 0.0–0.1)
LYMPHS: 40 %
Lymphocytes Absolute: 2.2 10*3/uL (ref 0.7–3.1)
MCH: 30 pg (ref 26.6–33.0)
MCHC: 34 g/dL (ref 31.5–35.7)
MCV: 88 fL (ref 79–97)
Monocytes Absolute: 0.3 10*3/uL (ref 0.1–0.9)
Monocytes: 5 %
NEUTROS PCT: 53 %
Neutrophils Absolute: 2.9 10*3/uL (ref 1.4–7.0)
PLATELETS: 235 10*3/uL (ref 150–450)
RBC: 4.4 x10E6/uL (ref 3.77–5.28)
RDW: 14 % (ref 12.3–15.4)
WBC: 5.5 10*3/uL (ref 3.4–10.8)

## 2017-07-03 LAB — LIPID PANEL W/O CHOL/HDL RATIO
CHOLESTEROL TOTAL: 248 mg/dL — AB (ref 100–199)
HDL: 46 mg/dL (ref >39)
LDL CALC: 159 mg/dL — AB (ref 0–99)
TRIGLYCERIDES: 215 mg/dL — AB (ref 0–149)
VLDL CHOLESTEROL CAL: 43 mg/dL — AB (ref 5–40)

## 2017-07-03 LAB — TSH: TSH: 2.92 u[IU]/mL (ref 0.450–4.500)

## 2017-07-03 LAB — T4, FREE: Free T4: 0.98 ng/dL (ref 0.82–1.77)

## 2017-07-03 LAB — VITAMIN D 25 HYDROXY (VIT D DEFICIENCY, FRACTURES): Vit D, 25-Hydroxy: 27.3 ng/mL — ABNORMAL LOW (ref 30.0–100.0)

## 2017-07-03 NOTE — Telephone Encounter (Signed)
Blood cts normal  Kidneys normal   ALT/liver enzyme sligh elevated 32 nl hers is 33   Urine normal   Cholesterol elevated (I.e total 248 normal <200, triglycerides 215 normal <150 and LDL 159 normal <130. HDL is 46  -mail cholesterol handout  -please try healthy diet and exercise and if cant reduce in 3-6 months will rec medication for cholesterol  -please come fasting to next appt to recheck cholesterol   Thyroid labs normal   Vitamin D slightly low 27.3 nl 30 please try D3 otc 2000 IU daily   [place]

## 2017-07-04 NOTE — Telephone Encounter (Signed)
Left message for patient to return call back. [facility] may give results.  

## 2017-07-09 ENCOUNTER — Encounter: Payer: Self-pay | Admitting: *Deleted

## 2017-07-24 ENCOUNTER — Ambulatory Visit
Admission: RE | Admit: 2017-07-24 | Discharge: 2017-07-24 | Disposition: A | Discharge status: Home / Self Care | Payer: 59 | Source: Ambulatory Visit | Attending: Internal Medicine | Admitting: Internal Medicine

## 2017-07-24 DIAGNOSIS — Z1231 Encounter for screening mammogram for malignant neoplasm of breast: Secondary | ICD-10-CM

## 2017-07-31 DIAGNOSIS — Z8371 Family history of colonic polyps: Secondary | ICD-10-CM | POA: Diagnosis not present

## 2017-07-31 DIAGNOSIS — Z1211 Encounter for screening for malignant neoplasm of colon: Secondary | ICD-10-CM | POA: Diagnosis not present

## 2017-07-31 DIAGNOSIS — K5904 Chronic idiopathic constipation: Secondary | ICD-10-CM | POA: Diagnosis not present

## 2017-08-04 ENCOUNTER — Encounter: Payer: Self-pay | Admitting: Radiology

## 2017-09-26 DIAGNOSIS — Z1211 Encounter for screening for malignant neoplasm of colon: Secondary | ICD-10-CM | POA: Diagnosis not present

## 2017-09-26 DIAGNOSIS — K6389 Other specified diseases of intestine: Secondary | ICD-10-CM | POA: Diagnosis not present

## 2017-09-26 DIAGNOSIS — Z8371 Family history of colonic polyps: Secondary | ICD-10-CM | POA: Diagnosis not present

## 2017-09-26 DIAGNOSIS — D125 Benign neoplasm of sigmoid colon: Secondary | ICD-10-CM | POA: Diagnosis not present

## 2017-09-26 DIAGNOSIS — K635 Polyp of colon: Secondary | ICD-10-CM | POA: Diagnosis not present

## 2017-09-26 LAB — HM COLONOSCOPY

## 2017-09-30 ENCOUNTER — Encounter: Payer: Self-pay | Admitting: Radiology

## 2017-10-01 ENCOUNTER — Encounter: Payer: Self-pay | Admitting: Internal Medicine

## 2017-10-01 ENCOUNTER — Ambulatory Visit: Payer: 59 | Admitting: Internal Medicine

## 2017-10-01 VITALS — BP 96/58 | HR 56 | Temp 98.3°F | Ht 68.0 in | Wt 247.8 lb

## 2017-10-01 DIAGNOSIS — K635 Polyp of colon: Secondary | ICD-10-CM | POA: Diagnosis not present

## 2017-10-01 DIAGNOSIS — K581 Irritable bowel syndrome with constipation: Secondary | ICD-10-CM | POA: Diagnosis not present

## 2017-10-01 DIAGNOSIS — E785 Hyperlipidemia, unspecified: Secondary | ICD-10-CM | POA: Diagnosis not present

## 2017-10-01 DIAGNOSIS — E559 Vitamin D deficiency, unspecified: Secondary | ICD-10-CM | POA: Diagnosis not present

## 2017-10-01 DIAGNOSIS — R748 Abnormal levels of other serum enzymes: Secondary | ICD-10-CM | POA: Diagnosis not present

## 2017-10-01 LAB — COMPREHENSIVE METABOLIC PANEL
ALK PHOS: 59 U/L (ref 39–117)
ALT: 25 U/L (ref 0–35)
AST: 25 U/L (ref 0–37)
Albumin: 3.8 g/dL (ref 3.5–5.2)
BUN: 14 mg/dL (ref 6–23)
CO2: 27 mEq/L (ref 19–32)
CREATININE: 1.07 mg/dL (ref 0.40–1.20)
Calcium: 9.4 mg/dL (ref 8.4–10.5)
Chloride: 105 mEq/L (ref 96–112)
GFR: 69.43 mL/min (ref >60.00)
Glucose, Bld: 90 mg/dL (ref 70–99)
Potassium: 4.2 mEq/L (ref 3.5–5.1)
Sodium: 138 mEq/L (ref 135–145)
TOTAL PROTEIN: 7.7 g/dL (ref 6.0–8.3)
Total Bilirubin: 0.4 mg/dL (ref 0.2–1.2)

## 2017-10-01 LAB — LIPID PANEL
CHOL/HDL RATIO: 5
Cholesterol: 227 mg/dL — ABNORMAL HIGH (ref 0–200)
HDL: 45 mg/dL (ref >39.00)
LDL Cholesterol: 145 mg/dL — ABNORMAL HIGH (ref 0–99)
NONHDL: 182.31
Triglycerides: 188 mg/dL — ABNORMAL HIGH (ref 0.0–149.0)
VLDL: 37.6 mg/dL (ref 0.0–40.0)

## 2017-10-01 MED ORDER — LINACLOTIDE 145 MCG PO CAPS: 145.0000 ug | ORAL_CAPSULE | Freq: Every day | ORAL | 3 refills | Status: DC

## 2017-10-01 NOTE — Progress Notes (Signed)
Chief Complaint  Patient presents with  . Follow-up   F/u  1. HLD (FH dad and daughter HLD dx'ed at 52 y.o) and elevated lfts with repeat labs today fasting  2. Colonoscopy last Friday D.r Juanita Craver with 5-6 polpys 3. Had MMR titer with [facility], will get flu shot at work  4. Constipation trulance helped insurance will not cover, linzess 145 helped and wants Rx motegretrity in the past did not help and she didn't like the way it made her feel    Review of Systems  Constitutional: Negative for weight loss.  HENT: Negative for hearing loss.   Eyes: Negative for blurred vision.  Respiratory: Negative for shortness of breath.   Cardiovascular: Negative for chest pain.  Gastrointestinal: Positive for constipation.  Musculoskeletal: Negative for falls.  Skin: Negative for rash.  Neurological: Negative for headaches.  Psychiatric/Behavioral: Negative for depression.   Past Medical History:  Diagnosis Date  . Bilateral chronic knee pain   . BRCA negative   . History of abnormal Pap smear   . Vitamin D deficiency    Past Surgical History:  Procedure Laterality Date  . BREAST EXCISIONAL BIOPSY Left 2000   benign  . BREAST LUMPECTOMY     bx 2/2 clogged milk duct 1.5 years after nursing daughter   . CESAREAN SECTION  1996  . LEEP     ?2014   Family History  Problem Relation Age of Onset  . Breast cancer Paternal Grandmother 64       deceased at 56  . Cancer Paternal Grandmother        breast  . Breast cancer Paternal Aunt        Paternal/unsure of age  . Cancer Paternal Aunt        breast  . Arthritis Paternal Aunt   . Breast cancer Cousin 35        3 paternal cousin  . Cancer Cousin        p side breast cancer   . Breast cancer Cousin 40       paternal  . Cancer Cousin        paternal side breast ca  . Breast cancer Cousin        paternal  . Diabetes Maternal Grandfather   . Hypertension Father   . Colon polyps Father   . Hyperlipidemia Father   . Colonic polyp  Father   . Arthritis Father   . Diabetes Mother   . Diverticulitis Mother   . Cancer Mother        uterine  . Hypertension Mother   . Diabetes Maternal Uncle   . Diabetes Maternal Grandmother    Social History   Socioeconomic History  . Marital status: Single    Spouse name: Not on file  . Number of children: Not on file  . Years of education: Not on file  . Highest education level: Not on file  Occupational History  . Not on file  Social Needs  . Financial resource strain: Not on file  . Food insecurity:    Worry: Not on file    Inability: Not on file  . Transportation needs:    Medical: Not on file    Non-medical: Not on file  Tobacco Use  . Smoking status: Never Smoker  . Smokeless tobacco: Never Used  Substance and Sexual Activity  . Alcohol use: No  . Drug use: No  . Sexual activity: Not Currently    Birth control/protection: Pill  Lifestyle  . Physical activity:    Days per week: Not on file    Minutes per session: Not on file  . Stress: Not on file  Relationships  . Social connections:    Talks on phone: Not on file    Gets together: Not on file    Attends religious service: Not on file    Active member of club or organization: Not on file    Attends meetings of clubs or organizations: Not on file    Relationship status: Not on file  . Intimate partner violence:    Fear of current or ex partner: Not on file    Emotionally abused: Not on file    Physically abused: Not on file    Forced sexual activity: Not on file  Other Topics Concern  . 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 [employer]   Father from [place] grew up in [place]   1 daughter 67 y.o as of 07/01/17    Current Meds  Medication Sig  . EPINEPHrine 0.3 mg/0.3 mL IJ SOAJ injection   . Levonorgestrel-Ethinyl Estradiol (SEASONIQUE) 0.15-0.03 &0.01 MG tablet Take 1 tablet by mouth daily.   Allergies  Allergen Reactions  . Latex     sensitive  . Other      Nuts tongue swelling  Berries rash   . Shellfish Allergy Hives   No results found for this or any previous visit (from the past 2160 hour(s)). Objective  Body mass index is 37.68 kg/m. Wt Readings from Last 3 Encounters:  10/01/17 247 lb 12.8 oz (112.4 kg)  07/01/17 247 lb 1.6 oz (112.1 kg)  12/04/15 248 lb (112.5 kg)   Temp Readings from Last 3 Encounters:  10/01/17 98.3 F (36.8 C) (Oral)  07/01/17 98.1 F (36.7 C) (Oral)   BP Readings from Last 3 Encounters:  10/01/17 (!) 96/58  07/01/17 102/62  12/04/15 114/76   Pulse Readings from Last 3 Encounters:  10/01/17 (!) 56  07/01/17 60  12/04/15 (!) 51    Physical Exam  Constitutional: She is oriented to person, place, and time. Vital signs are normal. She appears well-developed and well-nourished. She is cooperative.  HENT:  Head: Normocephalic and atraumatic.  Mouth/Throat: Oropharynx is clear and moist and mucous membranes are normal.  Eyes: Pupils are equal, round, and reactive to light. Conjunctivae are normal.  Cardiovascular: Normal rate, regular rhythm and normal heart sounds.  Pulmonary/Chest: Effort normal and breath sounds normal.  Neurological: She is alert and oriented to person, place, and time. Gait normal.  Skin: Skin is warm, dry and intact.  Psychiatric: She has a normal mood and affect. Her speech is normal and behavior is normal. Judgment and thought content normal. Cognition and memory are normal.  Vitals reviewed.   Assessment   1. HLD 2. Elevated lfts  3. Colon polyps  4. Chronic constipation vs ibs 5. Vit d def  6. HM Plan  1. Risk calculator does not indicate statin  rec exercise an dhealthy diet choices  2. Repeat lfts today if abnormal rec [place] ab Check cmet and lipid today  3. Get records Dr. Collene Mares in [place] had 1 week ago colonoscopy  4. linzess 145 helps  5. rec 2000 IU d3  6.  Flu shot had 10/2016 will get at work  Tdap ? 2016 not in [place] had with [facility] per pt  Given info about  shingrix prev and  disc again today declines    mammo 07/24/17 neg BRCA neg disc other options gene testing with OB/GYN Referred colonoscopy Dr. Juanita Craver and disc chronic constipation had 1 week ago get report  Pap had 12/04/15 neg neg HPV h/o LEEP f/u OB/GYN   rec exercise and healthy eating habits   01/2017 eye exam [place] eye nearsighted  [facility]   Declines STD testing, MMR (had at [facility]), hep b  Provider: Dr. Olivia Mackie [facility]Lean[facility]

## 2017-10-01 NOTE — Progress Notes (Signed)
Pre visit review using our clinic review tool, if applicable. No additional management support is needed unless otherwise documented below in the visit note. 

## 2017-10-01 NOTE — Patient Instructions (Signed)
Vitamin D3 2000 IU daily   Dr. Lois Huxley in [place]  Dr. Lanier Prude [facility] Dr. Merry Proud Scales [facility]   F/u in 6 months  Cholesterol Cholesterol is a white, waxy, fat-like substance that is needed by the human body in small amounts. The liver makes all the cholesterol we need. Cholesterol is carried from the liver by the blood through the blood vessels. Deposits of cholesterol (plaques) may build up on blood vessel (artery) walls. Plaques make the arteries narrower and stiffer. Cholesterol plaques increase the risk for heart attack and stroke. You cannot feel your cholesterol level even if it is very high. The only way to know that it is high is to have a blood test. Once you know your cholesterol levels, you should keep a record of the test results. Work with your health care provider to keep your levels in the desired range. What do the results mean?  Total cholesterol is a rough measure of all the cholesterol in your blood.  LDL (low-density lipoprotein) is the "bad" cholesterol. This is the type that causes plaque to build up on the artery walls. You want this level to be low.  HDL (high-density lipoprotein) is the "good" cholesterol because it cleans the arteries and carries the LDL away. You want this level to be high.  Triglycerides are fat that the body can either burn for energy or store. High levels are closely linked to heart disease. What are the desired levels of cholesterol?  Total cholesterol below 200.  LDL below 100 for people who are at risk, below 70 for people at very high risk.  HDL above 40 is good. A level of 60 or higher is considered to be protective against heart disease.  Triglycerides below 150. How can I lower my cholesterol? Diet Follow your diet program as told by your health care provider.  Choose fish or white meat chicken and [place], roasted or baked. Limit fatty cuts of red meat, fried foods, and processed meats, such as sausage and  lunch meats.  Eat lots of fresh fruits and vegetables.  Choose whole grains, beans, pasta, potatoes, and cereals.  Choose olive oil, corn oil, or canola oil, and use only small amounts.  Avoid butter, mayonnaise, shortening, or palm kernel oils.  Avoid foods with trans fats.  Drink skim or nonfat milk and eat low-fat or nonfat yogurt and cheeses. Avoid whole milk, cream, ice cream, egg yolks, and full-fat cheeses.  Healthier desserts include angel food cake, ginger snaps, animal crackers, hard candy, popsicles, and low-fat or nonfat frozen yogurt. Avoid pastries, cakes, pies, and cookies.  Exercise  Follow your exercise program as told by your health care provider. A regular program: ? Helps to decrease LDL and raise HDL. ? Helps with weight control.  Do things that increase your activity level, such as gardening, walking, and taking the stairs.  Ask your health care provider about ways that you can be more active in your daily life.  Medicine  Take over-the-counter and prescription medicines only as told by your health care provider. ? Medicine may be prescribed by your health care provider to help lower cholesterol and decrease the risk for heart disease. This is usually done if diet and exercise have failed to bring down cholesterol levels. ? If you have several risk factors, you may need medicine even if your levels are normal.  This information is not intended to replace advice given to you by your health care provider. Make sure you  discuss any questions you have with your health care provider. Document Released: 09/18/2000 Document Revised: 07/22/2015 Document Reviewed: 06/24/2015 Elsevier Interactive Patient Education  [employer].

## 2017-10-22 ENCOUNTER — Encounter: Payer: Self-pay | Admitting: Internal Medicine

## 2017-12-29 ENCOUNTER — Other Ambulatory Visit: Payer: Self-pay | Admitting: Obstetrics & Gynecology

## 2017-12-29 DIAGNOSIS — Z3041 Encounter for surveillance of contraceptive pills: Secondary | ICD-10-CM

## 2018-04-01 ENCOUNTER — Encounter: Payer: Self-pay | Admitting: Internal Medicine

## 2018-04-01 ENCOUNTER — Other Ambulatory Visit: Payer: Self-pay

## 2018-04-01 ENCOUNTER — Ambulatory Visit (INDEPENDENT_AMBULATORY_CARE_PROVIDER_SITE_OTHER): Payer: 59 | Admitting: Internal Medicine

## 2018-04-01 VITALS — BP 116/66 | HR 63 | Temp 98.3°F | Ht 68.0 in | Wt 254.6 lb

## 2018-04-01 DIAGNOSIS — Z1231 Encounter for screening mammogram for malignant neoplasm of breast: Secondary | ICD-10-CM | POA: Diagnosis not present

## 2018-04-01 DIAGNOSIS — E785 Hyperlipidemia, unspecified: Secondary | ICD-10-CM | POA: Diagnosis not present

## 2018-04-01 DIAGNOSIS — K5900 Constipation, unspecified: Secondary | ICD-10-CM

## 2018-04-01 LAB — LIPID PANEL
CHOLESTEROL: 245 mg/dL — AB (ref 0–200)
HDL: 45.2 mg/dL (ref >39.00)
LDL Cholesterol: 162 mg/dL — ABNORMAL HIGH (ref 0–99)
NonHDL: 199.71
Total CHOL/HDL Ratio: 5
Triglycerides: 191 mg/dL — ABNORMAL HIGH (ref 0.0–149.0)
VLDL: 38.2 mg/dL (ref 0.0–40.0)

## 2018-04-01 NOTE — Patient Instructions (Addendum)
Tdap vaccine due 11/15/2018   Debrox ear wax drops for ear wax drops  Earwax Buildup, Adult The ears produce a substance called earwax that helps keep bacteria out of the ear and protects the skin in the ear canal. Occasionally, earwax can build up in the ear and cause discomfort or hearing loss. What increases the risk? This condition is more likely to develop in people who:  Are female.  Are elderly.  Naturally produce more earwax.  Clean their ears often with cotton swabs.  Use earplugs often.  Use in-ear headphones often.  Wear hearing aids.  Have narrow ear canals.  Have earwax that is overly thick or sticky.  Have eczema.  Are dehydrated.  Have excess hair in the ear canal. What are the signs or symptoms? Symptoms of this condition include:  Reduced or muffled hearing.  A feeling of fullness in the ear or feeling that the ear is plugged.  Fluid coming from the ear.  Ear pain.  Ear itch.  Ringing in the ear.  Coughing.  An obvious piece of earwax that can be seen inside the ear canal. How is this diagnosed? This condition may be diagnosed based on:  Your symptoms.  Your medical history.  An ear exam. During the exam, your health care provider will look into your ear with an instrument called an otoscope. You may have tests, including a hearing test. How is this treated? This condition may be treated by:  Using ear drops to soften the earwax.  Having the earwax removed by a health care provider. The health care provider may: ? Flush the ear with water. ? Use an instrument that has a loop on the end (curette). ? Use a suction device.  Surgery to remove the wax buildup. This may be done in severe cases. Follow these instructions at home:   Take over-the-counter and prescription medicines only as told by your health care provider.  Do not put any objects, including cotton swabs, into your ear. You can clean the opening of your ear canal with a  washcloth or facial tissue.  Follow instructions from your health care provider about cleaning your ears. Do not over-clean your ears.  Drink enough fluid to keep your urine clear or pale yellow. This will help to thin the earwax.  Keep all follow-up visits as told by your health care provider. If earwax builds up in your ears often or if you use hearing aids, consider seeing your health care provider for routine, preventive ear cleanings. Ask your health care provider how often you should schedule your cleanings.  If you have hearing aids, clean them according to instructions from the manufacturer and your health care provider. Contact a health care provider if:  You have ear pain.  You develop a fever.  You have blood, pus, or other fluid coming from your ear.  You have hearing loss.  You have ringing in your ears that does not go away.  Your symptoms do not improve with treatment.  You feel like the room is spinning (vertigo). Summary  Earwax can build up in the ear and cause discomfort or hearing loss.  The most common symptoms of this condition include reduced or muffled hearing and a feeling of fullness in the ear or feeling that the ear is plugged.  This condition may be diagnosed based on your symptoms, your medical history, and an ear exam.  This condition may be treated by using ear drops to soften the earwax or  by having the earwax removed by a health care provider.  Do not put any objects, including cotton swabs, into your ear. You can clean the opening of your ear canal with a washcloth or facial tissue. This information is not intended to replace advice given to you by your health care provider. Make sure you discuss any questions you have with your health care provider. Document Released: 02/01/2004 Document Revised: 12/05/2016 Document Reviewed: 03/06/2016 Elsevier Interactive Patient Education  2019 Elsevier Inc.    Cholesterol Cholesterol is a white, waxy,  fat-like substance that is needed by the human body in small amounts. The liver makes all the cholesterol we need. Cholesterol is carried from the liver by the blood through the blood vessels. Deposits of cholesterol (plaques) may build up on blood vessel (artery) walls. Plaques make the arteries narrower and stiffer. Cholesterol plaques increase the risk for heart attack and stroke. You cannot feel your cholesterol level even if it is very high. The only way to know that it is high is to have a blood test. Once you know your cholesterol levels, you should keep a record of the test results. Work with your health care provider to keep your levels in the desired range. What do the results mean?  Total cholesterol is a rough measure of all the cholesterol in your blood.  LDL (low-density lipoprotein) is the "bad" cholesterol. This is the type that causes plaque to build up on the artery walls. You want this level to be low.  HDL (high-density lipoprotein) is the "good" cholesterol because it cleans the arteries and carries the LDL away. You want this level to be high.  Triglycerides are fat that the body can either burn for energy or store. High levels are closely linked to heart disease. What are the desired levels of cholesterol?  Total cholesterol below 200.  LDL below 100 for people who are at risk, below 70 for people at very high risk.  HDL above 40 is good. A level of 60 or higher is considered to be protective against heart disease.  Triglycerides below 150. How can I lower my cholesterol? Diet Follow your diet program as told by your health care provider.  Choose fish or white meat chicken and [place], roasted or baked. Limit fatty cuts of red meat, fried foods, and processed meats, such as sausage and lunch meats.  Eat lots of fresh fruits and vegetables.  Choose whole grains, beans, pasta, potatoes, and cereals.  Choose olive oil, corn oil, or canola oil, and use only small  amounts.  Avoid butter, mayonnaise, shortening, or palm kernel oils.  Avoid foods with trans fats.  Drink skim or nonfat milk and eat low-fat or nonfat yogurt and cheeses. Avoid whole milk, cream, ice cream, egg yolks, and full-fat cheeses.  Healthier desserts include angel food cake, ginger snaps, animal crackers, hard candy, popsicles, and low-fat or nonfat frozen yogurt. Avoid pastries, cakes, pies, and cookies.  Exercise  Follow your exercise program as told by your health care provider. A regular program: ? Helps to decrease LDL and raise HDL. ? Helps with weight control.  Do things that increase your activity level, such as gardening, walking, and taking the stairs.  Ask your health care provider about ways that you can be more active in your daily life. Medicine  Take over-the-counter and prescription medicines only as told by your health care provider. ? Medicine may be prescribed by your health care provider to help lower cholesterol and decrease  the risk for heart disease. This is usually done if diet and exercise have failed to bring down cholesterol levels. ? If you have several risk factors, you may need medicine even if your levels are normal. This information is not intended to replace advice given to you by your health care provider. Make sure you discuss any questions you have with your health care provider. Document Released: 09/18/2000 Document Revised: 07/22/2015 Document Reviewed: 06/24/2015 Elsevier Interactive Patient Education  [employer].

## 2018-04-01 NOTE — Progress Notes (Signed)
Chief Complaint  Patient presents with  . Follow-up   F/u  1. HLD not working out was doing yoga but covid stopped she is pick eater so foods for HLD diet she does not like fasting to get cholesterol checked today  2. Constipation linzess 145 takes 1x per week due to qd use gives diarrhea   Review of Systems  Constitutional: Negative for weight loss.  HENT: Negative for hearing loss.   Eyes: Negative for blurred vision.  Respiratory: Negative for shortness of breath.   Cardiovascular: Negative for chest pain.  Gastrointestinal: Negative for abdominal pain.  Musculoskeletal:       Right ankle pain    Skin: Negative for rash.  Neurological: Negative for headaches.  Psychiatric/Behavioral: Negative for depression.   Past Medical History:  Diagnosis Date  . Bilateral chronic knee pain   . BRCA negative   . History of abnormal Pap smear   . Hyperlipidemia   . Vitamin D deficiency    Past Surgical History:  Procedure Laterality Date  . BREAST EXCISIONAL BIOPSY Left 2000   benign  . BREAST LUMPECTOMY     bx 2/2 clogged milk duct 1.5 years after nursing daughter   . CESAREAN SECTION  1996  . LEEP     ?2014   Family History  Problem Relation Age of Onset  . Breast cancer Paternal Grandmother 87       deceased at 46  . Cancer Paternal Grandmother        breast  . Breast cancer Paternal Aunt        Paternal/unsure of age  . Cancer Paternal Aunt        breast  . Arthritis Paternal Aunt   . Breast cancer Cousin 81        3 paternal cousin  . Cancer Cousin        p side breast cancer   . Breast cancer Cousin 40       paternal  . Cancer Cousin        paternal side breast ca  . Breast cancer Cousin        paternal  . Diabetes Maternal Grandfather   . Hypertension Father   . Colon polyps Father   . Hyperlipidemia Father   . Colonic polyp Father   . Arthritis Father   . Diabetes Mother   . Diverticulitis Mother   . Cancer Mother        uterine  . Hypertension  Mother   . Diabetes Maternal Uncle   . Diabetes Maternal Grandmother    Social History   Socioeconomic History  . Marital status: Single    Spouse name: Not on file  . Number of children: Not on file  . Years of education: Not on file  . Highest education level: Not on file  Occupational History  . Not on file  Social Needs  . Financial resource strain: Not on file  . Food insecurity:    Worry: Not on file    Inability: Not on file  . Transportation needs:    Medical: Not on file    Non-medical: Not on file  Tobacco Use  . Smoking status: Never Smoker  . Smokeless tobacco: Never Used  Substance and Sexual Activity  . Alcohol use: No  . Drug use: No  . Sexual activity: Not Currently    Birth control/protection: Pill  Lifestyle  . Physical activity:    Days per week: Not on file  Minutes per session: Not on file  . Stress: Not on file  Relationships  . Social connections:    Talks on phone: Not on file    Gets together: Not on file    Attends religious service: Not on file    Active member of club or organization: Not on file    Attends meetings of clubs or organizations: Not on file    Relationship status: Not on file  . Intimate partner violence:    Fear of current or ex partner: Not on file    Emotionally abused: Not on file    Physically abused: Not on file    Forced sexual activity: Not on file  Other Topics Concern  . 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 [employer]   Father from [place] grew up in [place]   1 daughter 52 y.o as of 07/01/17    Current Meds  Medication Sig  . EPINEPHrine 0.3 mg/0.3 mL IJ SOAJ injection   . Levonorgestrel-Ethinyl Estradiol (AMETHIA,CAMRESE) 0.15-0.03 &0.01 MG tablet TAKE 1 TABLET BY MOUTH EVERY DAY  . linaclotide (LINZESS) 145 MCG CAPS capsule Take 1 capsule (145 mcg total) by mouth daily before breakfast.   Allergies  Allergen Reactions  . Latex     sensitive  . Other     Nuts  tongue swelling  Berries rash   . Shellfish Allergy Hives   No results found for this or any previous visit (from the past 2160 hour(s)). Objective  Body mass index is 38.71 kg/m. Wt Readings from Last 3 Encounters:  04/01/18 254 lb 9.6 oz (115.5 kg)  10/01/17 247 lb 12.8 oz (112.4 kg)  07/01/17 247 lb 1.6 oz (112.1 kg)   Temp Readings from Last 3 Encounters:  04/01/18 98.3 F (36.8 C) (Oral)  10/01/17 98.3 F (36.8 C) (Oral)  07/01/17 98.1 F (36.7 C) (Oral)   BP Readings from Last 3 Encounters:  04/01/18 116/66  10/01/17 (!) 96/58  07/01/17 102/62   Pulse Readings from Last 3 Encounters:  04/01/18 63  10/01/17 (!) 56  07/01/17 60    Physical Exam Vitals signs and nursing note reviewed.  Constitutional:      Appearance: Normal appearance. She is well-developed and well-groomed.  HENT:     Head: Normocephalic and atraumatic.     Nose: Nose normal.     Mouth/Throat:     Mouth: Mucous membranes are moist.     Pharynx: Oropharynx is clear.  Eyes:     Conjunctiva/sclera: Conjunctivae normal.     Pupils: Pupils are equal, round, and reactive to light.  Cardiovascular:     Rate and Rhythm: Normal rate and regular rhythm.     Heart sounds: Normal heart sounds.  Pulmonary:     Effort: Pulmonary effort is normal.     Breath sounds: Normal breath sounds.  Skin:    General: Skin is warm and dry.  Neurological:     General: No focal deficit present.     Mental Status: She is alert and oriented to person, place, and time. Mental status is at baseline.     Gait: Gait normal.  Psychiatric:        Attention and Perception: Attention and perception normal.        Mood and Affect: Mood and affect normal.        Speech: Speech normal.        Behavior: Behavior normal. Behavior  is cooperative.        Thought Content: Thought content normal.        Cognition and Memory: Cognition and memory normal.        Judgment: Judgment normal.     Assessment   1. HLD  2.  Constipation  3. HM Plan   1/ check fasting lipid todaay  2. linzess  3.  Flu shot had 10/2016 will get at work 10/2017 had  Tdap 11/14/2008 Given info about shingrix prev and disc again today declines    mammo 07/24/17 neg BRCA neg disc other options gene testing with OB/GYN -referred today colonoscopy  colonoscopy Dr. Juanita Craver 09/26/17 small 5 polyps hyperplastic f/u in 10 years, IH  FH uterine cancer  GM and cousin  Pap had 12/04/15 neg neg HPV h/o LEEP f/u OB/GYN   rec exercise and healthy eating habits   01/2017 eye exam [place] eye nearsighted  [facility] dentist   Declines STD testing, MMR (had at [facility]), hep b  Provider: Dr. Olivia Mackie [facility]

## 2018-04-01 NOTE — Progress Notes (Signed)
Pre visit review using our clinic review tool, if applicable. No additional management support is needed unless otherwise documented below in the visit note. 

## 2018-12-29 ENCOUNTER — Ambulatory Visit
Admission: RE | Admit: 2018-12-29 | Discharge: 2018-12-29 | Disposition: A | Discharge status: Home / Self Care | Payer: 59 | Source: Ambulatory Visit | Attending: Internal Medicine | Admitting: Internal Medicine

## 2018-12-29 ENCOUNTER — Other Ambulatory Visit: Payer: Self-pay

## 2018-12-29 DIAGNOSIS — Z1231 Encounter for screening mammogram for malignant neoplasm of breast: Secondary | ICD-10-CM | POA: Diagnosis not present

## 2018-12-30 ENCOUNTER — Other Ambulatory Visit: Payer: Self-pay

## 2018-12-30 ENCOUNTER — Ambulatory Visit (INDEPENDENT_AMBULATORY_CARE_PROVIDER_SITE_OTHER): Payer: 59 | Admitting: Internal Medicine

## 2018-12-30 ENCOUNTER — Encounter: Payer: Self-pay | Admitting: Internal Medicine

## 2018-12-30 VITALS — Ht 68.0 in | Wt 226.1 lb

## 2018-12-30 DIAGNOSIS — Z1329 Encounter for screening for other suspected endocrine disorder: Secondary | ICD-10-CM | POA: Diagnosis not present

## 2018-12-30 DIAGNOSIS — E559 Vitamin D deficiency, unspecified: Secondary | ICD-10-CM | POA: Diagnosis not present

## 2018-12-30 DIAGNOSIS — E785 Hyperlipidemia, unspecified: Secondary | ICD-10-CM

## 2018-12-30 DIAGNOSIS — Z0000 Encounter for general adult medical examination without abnormal findings: Secondary | ICD-10-CM | POA: Diagnosis not present

## 2018-12-30 DIAGNOSIS — Z1389 Encounter for screening for other disorder: Secondary | ICD-10-CM | POA: Diagnosis not present

## 2018-12-30 DIAGNOSIS — K581 Irritable bowel syndrome with constipation: Secondary | ICD-10-CM | POA: Diagnosis not present

## 2018-12-30 MED ORDER — LINACLOTIDE 290 MCG PO CAPS: 290.0000 ug | ORAL_CAPSULE | Freq: Every day | ORAL | 3 refills | Status: DC

## 2018-12-30 NOTE — Progress Notes (Signed)
Virtual Visit via Video Note  I connected with [name]   on 12/30/18 at  9:00 AM EST by a video enabled telemedicine application and verified that I am speaking with the correct person using two identifiers.  Location patient: home Location provider:work or home office Persons participating in the virtual visit: patient, provider  I discussed the limitations of evaluation and management by telemedicine and the availability of in person appointments. The patient expressed understanding and agreed to proceed.   HPI: 1. Annual overall doing well down 226 #s trying Noom app reduced sweet tea, pretzels, snacks and overall sugary foods and increased water  2. HLD repeat lipid lacorp 3. Constipation linzess 145 taking 2 will increase dose and if this is not working will f/u with GI still has constipation on higher dose disc Miralx today    ROS: See pertinent positives and negatives per HPI. General: wt reduced trying  HEENT: normal hearing  CV: no chest pain  Lungs: no sob  GI: +constipation and +hemorrhoids  GU: no vaginal bleeding  MSK: no jt pain  Skin: moles/skin lesions to face regrown after removal 10 years ago  Psych: mood normal  Neuro: no h/a  Past Medical History:  Diagnosis Date  . Bilateral chronic knee pain   . BRCA negative   . History of abnormal Pap smear   . Hyperlipidemia   . Vitamin D deficiency     Past Surgical History:  Procedure Laterality Date  . BREAST EXCISIONAL BIOPSY Left 2000   benign  . BREAST LUMPECTOMY     bx 2/2 clogged milk duct 1.5 years after nursing daughter   . CESAREAN SECTION  1996  . LEEP     ?2014    Family History  Problem Relation Age of Onset  . Breast cancer Paternal Grandmother 2       deceased at 67  . Cancer Paternal Grandmother        breast  . Breast cancer Paternal Aunt        Paternal/unsure of age  . Cancer Paternal Aunt        breast  . Arthritis Paternal Aunt   . Breast cancer Cousin 22        3  paternal cousin  . Cancer Cousin        p side breast cancer   . Breast cancer Cousin 40       paternal  . Cancer Cousin        paternal side breast ca  . Breast cancer Cousin        paternal  . Diabetes Maternal Grandfather   . Hypertension Father   . Colon polyps Father   . Hyperlipidemia Father   . Colonic polyp Father   . Arthritis Father   . Diabetes Mother   . Diverticulitis Mother   . Cancer Mother        uterine  . Hypertension Mother   . Diabetes Maternal Uncle   . Diabetes Maternal Grandmother     SOCIAL HX:  Daughter Corey Skains Lives alone and at times with Daughter    Current Outpatient Medications:  .  EPINEPHrine 0.3 mg/0.3 mL IJ SOAJ injection, , Disp: , Rfl: 2 .  linaclotide (LINZESS) 290 MCG CAPS capsule, Take 1 capsule (290 mcg total) by mouth daily before breakfast., Disp: 90 capsule, Rfl: 3 .  Levonorgestrel-Ethinyl Estradiol (AMETHIA,CAMRESE) 0.15-0.03 &0.01 MG tablet, TAKE 1 TABLET BY MOUTH EVERY DAY (Patient not taking: Reported on 12/30/2018), Disp: 91  tablet, Rfl: 5  EXAM:  VITALS per patient if applicable:  GENERAL: alert, oriented, appears well and in no acute distress  HEENT: atraumatic, conjunttiva clear, no obvious abnormalities on inspection of external nose and ears  NECK: normal movements of the head and neck  LUNGS: on inspection no signs of respiratory distress, breathing rate appears normal, no obvious gross SOB, gasping or wheezing  CV: no obvious cyanosis  MS: moves all visible extremities without noticeable abnormality  PSYCH/NEURO: pleasant and cooperative, no obvious depression or anxiety, speech and thought processing grossly intact  ASSESSMENT AND PLAN:  Discussed the following assessment and plan:  Well adult exam -  Flu shot utd 2020 10/23/2018  Tdap 11/14/2008 can get with ob/gyn Dr. Loni Muse shingrixprev disc pt declined  mammo 12/29/18 pending result  -BRCA neg disc other options gene testing with OB/GYN colonoscopy  Dr. Juanita Craver 09/26/17 small 5 polyps hyperplastic f/u in 10 years, IH  FH uterine cancer GM and cousin  Pap had 12/04/15 neg neg HPV h/o LEEP f/u OB/GYN  -call Dr. Loni Muse ob/gyn and set up repeat   rec exercise and healthy eating habits to continue she likes 30 minutes of dance   01/2017 eye exam [place] [facility] nearsighted  [facility] dentist    Irritable bowel syndrome with constipation - Plan: linaclotide (LINZESS) 290 MCG CAPS capsule Miralax prn  F/u with Dr. Collene Mares if this does not help   Hyperlipidemia, unspecified hyperlipidemia type - Plan: Lipid panel  Vitamin D deficiency - Plan: Vitamin D (25 hydroxy)   Prev Declines STD testing, MMR(had at [facility]), hep b -we discussed possible serious and likely etiologies, options for evaluation and workup, limitations of telemedicine visit vs in person visit, treatment, treatment risks and precautions. Pt prefers to treat via telemedicine empirically rather then risking or undertaking an in person visit at this moment. Patient agrees to seek prompt in person care if worsening, new symptoms arise, or if is not improving with treatment.   I discussed the assessment and treatment plan with the patient. The patient was provided an opportunity to ask questions and all were answered. The patient agreed with the plan and demonstrated an understanding of the instructions.   The patient was advised to call back or seek an in-person evaluation if the symptoms worsen or if the condition fails to improve as anticipated.  Time spent 20 minutes  Delorise Jackson, MD

## 2018-12-31 ENCOUNTER — Telehealth: Payer: Self-pay | Admitting: Internal Medicine

## 2018-12-31 DIAGNOSIS — Z0000 Encounter for general adult medical examination without abnormal findings: Secondary | ICD-10-CM | POA: Diagnosis not present

## 2018-12-31 DIAGNOSIS — E559 Vitamin D deficiency, unspecified: Secondary | ICD-10-CM | POA: Diagnosis not present

## 2018-12-31 DIAGNOSIS — Z1389 Encounter for screening for other disorder: Secondary | ICD-10-CM | POA: Diagnosis not present

## 2018-12-31 DIAGNOSIS — Z1329 Encounter for screening for other suspected endocrine disorder: Secondary | ICD-10-CM | POA: Diagnosis not present

## 2018-12-31 DIAGNOSIS — E785 Hyperlipidemia, unspecified: Secondary | ICD-10-CM | POA: Diagnosis not present

## 2018-12-31 NOTE — Telephone Encounter (Signed)
LVM for pt to call back and schedule 6-12 month appt

## 2019-01-01 LAB — LIPID PANEL
Chol/HDL Ratio: 4.3 ratio (ref 0.0–4.4)
Cholesterol, Total: 239 mg/dL — ABNORMAL HIGH (ref 100–199)
HDL: 56 mg/dL (ref >39)
LDL Chol Calc (NIH): 163 mg/dL — ABNORMAL HIGH (ref 0–99)
Triglycerides: 115 mg/dL (ref 0–149)
VLDL Cholesterol Cal: 20 mg/dL (ref 5–40)

## 2019-01-01 LAB — CBC WITH DIFFERENTIAL/PLATELET
Basophils Absolute: 0 10*3/uL (ref 0.0–0.2)
Basos: 1 %
EOS (ABSOLUTE): 0.1 10*3/uL (ref 0.0–0.4)
Eos: 3 %
Hematocrit: 39.4 % (ref 34.0–46.6)
Hemoglobin: 13 g/dL (ref 11.1–15.9)
Immature Grans (Abs): 0 10*3/uL (ref 0.0–0.1)
Immature Granulocytes: 0 %
Lymphocytes Absolute: 2.1 10*3/uL (ref 0.7–3.1)
Lymphs: 48 %
MCH: 28.9 pg (ref 26.6–33.0)
MCHC: 33 g/dL (ref 31.5–35.7)
MCV: 88 fL (ref 79–97)
Monocytes Absolute: 0.3 10*3/uL (ref 0.1–0.9)
Monocytes: 6 %
Neutrophils Absolute: 1.8 10*3/uL (ref 1.4–7.0)
Neutrophils: 42 %
Platelets: 177 10*3/uL (ref 150–450)
RBC: 4.5 x10E6/uL (ref 3.77–5.28)
RDW: 13.1 % (ref 11.7–15.4)
WBC: 4.3 10*3/uL (ref 3.4–10.8)

## 2019-01-01 LAB — URINALYSIS, ROUTINE W REFLEX MICROSCOPIC
Bilirubin, UA: NEGATIVE
Glucose, UA: NEGATIVE
Ketones, UA: NEGATIVE
Leukocytes,UA: NEGATIVE
Nitrite, UA: NEGATIVE
Protein,UA: NEGATIVE
Specific Gravity, UA: 1.013 (ref 1.005–1.030)
Urobilinogen, Ur: 0.2 mg/dL (ref 0.2–1.0)
pH, UA: 5.5 (ref 5.0–7.5)

## 2019-01-01 LAB — COMPREHENSIVE METABOLIC PANEL
ALT: 36 IU/L — ABNORMAL HIGH (ref 0–32)
AST: 29 IU/L (ref 0–40)
Albumin/Globulin Ratio: 1.1 — ABNORMAL LOW (ref 1.2–2.2)
Albumin: 3.8 g/dL (ref 3.8–4.9)
Alkaline Phosphatase: 79 IU/L (ref 39–117)
BUN/Creatinine Ratio: 18 (ref 9–23)
BUN: 16 mg/dL (ref 6–24)
Bilirubin Total: 0.4 mg/dL (ref 0.0–1.2)
CO2: 25 mmol/L (ref 20–29)
Calcium: 9.9 mg/dL (ref 8.7–10.2)
Chloride: 101 mmol/L (ref 96–106)
Creatinine, Ser: 0.87 mg/dL (ref 0.57–1.00)
GFR calc Af Amer: 89 mL/min/{1.73_m2} (ref >59)
GFR calc non Af Amer: 77 mL/min/{1.73_m2} (ref >59)
Globulin, Total: 3.4 g/dL (ref 1.5–4.5)
Glucose: 96 mg/dL (ref 65–99)
Potassium: 4.1 mmol/L (ref 3.5–5.2)
Sodium: 140 mmol/L (ref 134–144)
Total Protein: 7.2 g/dL (ref 6.0–8.5)

## 2019-01-01 LAB — MICROSCOPIC EXAMINATION
Bacteria, UA: NONE SEEN
Casts: NONE SEEN /lpf

## 2019-01-01 LAB — TSH: TSH: 2.27 u[IU]/mL (ref 0.450–4.500)

## 2019-01-01 LAB — VITAMIN D 25 HYDROXY (VIT D DEFICIENCY, FRACTURES): Vit D, 25-Hydroxy: 33.5 ng/mL (ref 30.0–100.0)

## 2019-02-02 ENCOUNTER — Encounter: Payer: Self-pay | Admitting: Obstetrics & Gynecology

## 2019-02-02 ENCOUNTER — Ambulatory Visit (INDEPENDENT_AMBULATORY_CARE_PROVIDER_SITE_OTHER): Payer: 59 | Admitting: Obstetrics & Gynecology

## 2019-02-02 ENCOUNTER — Other Ambulatory Visit: Payer: Self-pay

## 2019-02-02 VITALS — BP 132/80 | HR 72 | Wt 226.6 lb

## 2019-02-02 DIAGNOSIS — Z1151 Encounter for screening for human papillomavirus (HPV): Secondary | ICD-10-CM

## 2019-02-02 DIAGNOSIS — Z23 Encounter for immunization: Secondary | ICD-10-CM

## 2019-02-02 DIAGNOSIS — Z124 Encounter for screening for malignant neoplasm of cervix: Secondary | ICD-10-CM | POA: Diagnosis not present

## 2019-02-02 DIAGNOSIS — Z01419 Encounter for gynecological examination (general) (routine) without abnormal findings: Secondary | ICD-10-CM

## 2019-02-02 MED ORDER — TETANUS-DIPHTH-ACELL PERTUSSIS 5-2.5-18.5 LF-MCG/0.5 IM SUSP
0.5000 mL | Freq: Once | INTRAMUSCULAR | Status: AC
  Administered 2019-02-02: 09:00:00 0.5 mL via INTRAMUSCULAR

## 2019-02-02 NOTE — Patient Instructions (Signed)

## 2019-02-02 NOTE — Progress Notes (Signed)
GYNECOLOGY ANNUAL PREVENTATIVE CARE ENCOUNTER NOTE  History:     [name] is a 54 y.o. G1P1 female here for a routine annual gynecologic exam.  Current complaints: none.  Stopped OCPs last year around October, last had period/spotting in 12/2018. Denies any hot flashes, night sweats, mood swings or other symptoms.   Denies abnormal vaginal bleeding, discharge, pelvic pain, problems with intercourse or other gynecologic concerns.    Gynecologic History No LMP recorded. (Menstrual status: Other). Contraception: none Last Pap: 2017. Results were: normal with negative HPV. Previous history of CIN III s/p LEEP. Last mammogram: 12/2018. Results were: normal  Obstetric History OB History  Gravida Para Term Preterm AB Living  1 1       1   SAB TAB Ectopic Multiple Live Births          1    # Outcome Date GA Lbr Len/2nd Weight Sex Delivery Anes PTL Lv  1 Para     F CS-Classical   LIV    Past Medical History:  Diagnosis Date  . Bilateral chronic knee pain   . BRCA negative   . History of abnormal Pap smear   . Hyperlipidemia   . Vitamin D deficiency     Past Surgical History:  Procedure Laterality Date  . BREAST EXCISIONAL BIOPSY Left 2000   benign  . BREAST LUMPECTOMY     bx 2/2 clogged milk duct 1.5 years after nursing daughter   . CESAREAN SECTION  1996  . LEEP     ?2014    Current Outpatient Medications on File Prior to Visit  Medication Sig Dispense Refill  . EPINEPHrine 0.3 mg/0.3 mL IJ SOAJ injection   2  . linaclotide (LINZESS) 290 MCG CAPS capsule Take 1 capsule (290 mcg total) by mouth daily before breakfast. 90 capsule 3   No current facility-administered medications on file prior to visit.    Allergies  Allergen Reactions  . Latex     sensitive  . Other     Nuts tongue swelling  Berries rash   . Shellfish Allergy Hives    Social History:  reports that she has never smoked. She has never used smokeless tobacco. She reports that she does not  drink alcohol or use drugs.  Family History  Problem Relation Age of Onset  . Breast cancer Paternal Grandmother 30       deceased at 57  . Cancer Paternal Grandmother        breast  . Breast cancer Paternal Aunt        Paternal/unsure of age  . Cancer Paternal Aunt        breast  . Arthritis Paternal Aunt   . Breast cancer Cousin 21        3 paternal cousin  . Cancer Cousin        p side breast cancer   . Breast cancer Cousin 40       paternal  . Cancer Cousin        paternal side breast ca  . Breast cancer Cousin        paternal  . Diabetes Maternal Grandfather   . Hypertension Father   . Colon polyps Father   . Hyperlipidemia Father   . Colonic polyp Father   . Arthritis Father   . Diabetes Mother   . Diverticulitis Mother   . Cancer Mother        uterine  . Hypertension Mother   . Diabetes Maternal  Uncle   . Diabetes Maternal Grandmother   . Bipolar disorder Daughter     The following portions of the patient's history were reviewed and updated as appropriate: allergies, current medications, past family history, past medical history, past social history, past surgical history and problem list.  Review of Systems Pertinent items noted in HPI and remainder of comprehensive ROS otherwise negative.  Physical Exam:  BP 132/80   Pulse 72   Wt 226 lb 9.6 oz (102.8 kg)   BMI 34.45 kg/m  CONSTITUTIONAL: Well-developed, well-nourished female in no acute distress.  HENT:  Normocephalic, atraumatic, External right and left ear normal. Oropharynx is clear and moist EYES: Conjunctivae and EOM are normal. Pupils are equal, round, and reactive to light. No scleral icterus.  NECK: Normal range of motion, supple, no masses.  Normal thyroid.  SKIN: Skin is warm and dry. No rash noted. Not diaphoretic. No erythema. No pallor. MUSCULOSKELETAL: Normal range of motion. No tenderness.  No cyanosis, clubbing, or edema.  2+ distal pulses. NEUROLOGIC: Alert and oriented to person,  place, and time. Normal reflexes, muscle tone coordination.  PSYCHIATRIC: Normal mood and affect. Normal behavior. Normal judgment and thought content. CARDIOVASCULAR: Normal heart rate noted, regular rhythm RESPIRATORY: Clear to auscultation bilaterally. Effort and breath sounds normal, no problems with respiration noted. BREASTS: Symmetric in size. No masses, tenderness, skin changes, nipple drainage, or lymphadenopathy bilaterally. ABDOMEN: Soft, no distention noted.  No tenderness, rebound or guarding.  PELVIC: Normal appearing external genitalia and urethral meatus; normal appearing vaginal mucosa and cervix.  No abnormal discharge noted.  Pap smear obtained.  Normal uterine size, no other palpable masses, no uterine or adnexal tenderness.   Assessment and Plan:    1. Need for Tdap vaccination - Tdap (BOOSTRIX) injection 0.5 mL given  2. Well woman exam with routine gynecological exam - Cytology - PAP( [place]) Will follow up results of pap smear and manage accordingly. Mammogram is up to date, so is colonoscopy Routine preventative health maintenance measures emphasized. Please refer to After Visit Summary for other counseling recommendations.      Verita Schneiders, MD, [place] for [employer], [place]

## 2019-02-03 LAB — CYTOLOGY - PAP
Comment: NEGATIVE
Diagnosis: NEGATIVE
High risk HPV: NEGATIVE

## 2019-03-17 ENCOUNTER — Encounter: Payer: 59 | Admitting: Internal Medicine

## 2019-06-04 ENCOUNTER — Telehealth: Payer: Self-pay | Admitting: Internal Medicine

## 2019-06-04 NOTE — Telephone Encounter (Signed)
Called pt to schedule physical/follow up. Pt said at this time she doesn't need a follow up and will call back closer to December to schedule physical.

## 2019-11-16 ENCOUNTER — Encounter: Payer: Self-pay | Admitting: Radiology

## 2020-01-05 ENCOUNTER — Ambulatory Visit (INDEPENDENT_AMBULATORY_CARE_PROVIDER_SITE_OTHER): Payer: 59 | Admitting: Internal Medicine

## 2020-01-05 ENCOUNTER — Other Ambulatory Visit: Payer: Self-pay

## 2020-01-05 ENCOUNTER — Other Ambulatory Visit: Payer: Self-pay | Admitting: Internal Medicine

## 2020-01-05 ENCOUNTER — Encounter: Payer: Self-pay | Admitting: Internal Medicine

## 2020-01-05 VITALS — BP 116/70 | HR 69 | Temp 98.4°F | Ht 68.0 in | Wt 238.2 lb

## 2020-01-05 DIAGNOSIS — E559 Vitamin D deficiency, unspecified: Secondary | ICD-10-CM | POA: Diagnosis not present

## 2020-01-05 DIAGNOSIS — H6123 Impacted cerumen, bilateral: Secondary | ICD-10-CM | POA: Diagnosis not present

## 2020-01-05 DIAGNOSIS — Z1231 Encounter for screening mammogram for malignant neoplasm of breast: Secondary | ICD-10-CM | POA: Diagnosis not present

## 2020-01-05 DIAGNOSIS — Z1329 Encounter for screening for other suspected endocrine disorder: Secondary | ICD-10-CM

## 2020-01-05 DIAGNOSIS — Z0000 Encounter for general adult medical examination without abnormal findings: Secondary | ICD-10-CM

## 2020-01-05 DIAGNOSIS — Z1389 Encounter for screening for other disorder: Secondary | ICD-10-CM

## 2020-01-05 DIAGNOSIS — K581 Irritable bowel syndrome with constipation: Secondary | ICD-10-CM

## 2020-01-05 DIAGNOSIS — T782XXD Anaphylactic shock, unspecified, subsequent encounter: Secondary | ICD-10-CM

## 2020-01-05 DIAGNOSIS — E785 Hyperlipidemia, unspecified: Secondary | ICD-10-CM

## 2020-01-05 MED ORDER — EPINEPHRINE 0.3 MG/0.3ML IJ SOAJ: 0.3000 mg | INTRAMUSCULAR | 2 refills | Status: DC | PRN

## 2020-01-05 MED ORDER — LINACLOTIDE 290 MCG PO CAPS: 290.0000 ug | ORAL_CAPSULE | Freq: Every day | ORAL | 3 refills | Status: DC

## 2020-01-05 NOTE — Patient Instructions (Addendum)
[facility]   Zoster Vaccine, Recombinant injection What is this medicine? ZOSTER VACCINE (ZOS ter vak SEEN) is used to prevent shingles in adults 53 years old and over. This vaccine is not used to treat shingles or nerve pain from shingles. This medicine may be used for other purposes; ask your health care provider or pharmacist if you have questions. COMMON BRAND NAME(S): [facility] What should I tell my health care provider before I take this medicine? They need to know if you have any of these conditions:  blood disorders or disease  cancer like leukemia or lymphoma  immune system problems or therapy  an unusual or allergic reaction to vaccines, other medications, foods, dyes, or preservatives  pregnant or trying to get pregnant  breast-feeding How should I use this medicine? This vaccine is for injection in a muscle. It is given by a health care professional. Talk to your pediatrician regarding the use of this medicine in children. This medicine is not approved for use in children. Overdosage: If you think you have taken too much of this medicine contact a poison control center or emergency room at once. NOTE: This medicine is only for you. Do not share this medicine with others. What if I miss a dose? Keep appointments for follow-up (booster) doses as directed. It is important not to miss your dose. Call your doctor or health care professional if you are unable to keep an appointment. What may interact with this medicine?  medicines that suppress your immune system  medicines to treat cancer  steroid medicines like prednisone or cortisone This list may not describe all possible interactions. Give your health care provider a list of all the medicines, herbs, non-prescription drugs, or dietary supplements you use. Also tell them if you smoke, drink alcohol, or use illegal drugs. Some items may interact with your medicine. What should I watch for while using this  medicine? Visit your doctor for regular check ups. This vaccine, like all vaccines, may not fully protect everyone. What side effects may I notice from receiving this medicine? Side effects that you should report to your doctor or health care professional as soon as possible:  allergic reactions like skin rash, itching or hives, swelling of the face, lips, or tongue  breathing problems Side effects that usually do not require medical attention (report these to your doctor or health care professional if they continue or are bothersome):  chills  headache  fever  nausea, vomiting  redness, warmth, pain, swelling or itching at site where injected  tiredness This list may not describe all possible side effects. Call your doctor for medical advice about side effects. You may report side effects to FDA at 1-800-FDA-1088. Where should I keep my medicine? This vaccine is only given in a clinic, pharmacy, doctor's office, or other health care setting and will not be stored at home. NOTE: This sheet is a summary. It may not cover all possible information. If you have questions about this medicine, talk to your doctor, pharmacist, or health care provider.  2020 Elsevier/Gold Standard (2016-08-05 13:20:30)   Pravastatin Tablets What is this medicine? PRAVASTATIN (PRA va stat in) is known as a HMG-CoA reductase inhibitor or 'statin'. It lowers the level of cholesterol and triglycerides in the blood. This drug may also reduce the risk of heart attack, stroke, or other health problems in patients with risk factors for heart disease. Diet and lifestyle changes are often used with this drug. This medicine may be used for other  purposes; ask your health care provider or pharmacist if you have questions. COMMON BRAND NAME(S): Pravachol What should I tell my health care provider before I take this medicine? They need to know if you have any of these conditions:  diabetes  if you often drink  alcohol  history of stroke  kidney disease  liver disease  muscle aches or weakness  thyroid disease  an unusual or allergic reaction to pravastatin, other medicines, foods, dyes, or preservatives  pregnant or trying to get pregnant  breast-feeding How should I use this medicine? Take pravastatin tablets by mouth. Swallow the tablets with a drink of water. Pravastatin can be taken at anytime of the day, with or without food. Follow the directions on the prescription label. Take your doses at regular intervals. Do not take your medicine more often than directed. Talk to your pediatrician regarding the use of this medicine in children. Special care may be needed. Pravastatin has been used in children as young as 31 years of age. Overdosage: If you think you have taken too much of this medicine contact a poison control center or emergency room at once. NOTE: This medicine is only for you. Do not share this medicine with others. What if I miss a dose? If you miss a dose, take it as soon as you can. If it is almost time for your next dose, take only that dose. Do not take double or extra doses. What may interact with this medicine? This medicine may interact with the following medications:  colchicine  cyclosporine  other medicines for high cholesterol  some antibiotics like azithromycin, clarithromycin, erythromycin, and telithromycin This list may not describe all possible interactions. Give your health care provider a list of all the medicines, herbs, non-prescription drugs, or dietary supplements you use. Also tell them if you smoke, drink alcohol, or use illegal drugs. Some items may interact with your medicine. What should I watch for while using this medicine? Visit your doctor or health care professional for regular check-ups. You may need regular tests to make sure your liver is working properly. Your health care professional may tell you to stop taking this medicine if you  develop muscle problems. If your muscle problems do not go away after stopping this medicine, contact your health care professional. Do not become pregnant while taking this medicine. Women should inform their health care professional if they wish to become pregnant or think they might be pregnant. There is a potential for serious side effects to an unborn child. Talk to your health care professional or pharmacist for more information. Do not breast-feed an infant while taking this medicine. This medicine may affect blood sugar levels. If you have diabetes, check with your doctor or health care professional before you change your diet or the dose of your diabetic medicine. If you are going to need surgery or other procedure, tell your doctor that you are using this medicine. This drug is only part of a total heart-health program. Your doctor or a dietician can suggest a low-cholesterol and low-fat diet to help. Avoid alcohol and smoking, and keep a proper exercise schedule. This medicine may cause a decrease in Co-Enzyme Q-10. You should make sure that you get enough Co-Enzyme Q-10 while you are taking this medicine. Discuss the foods you eat and the vitamins you take with your health care professional. What side effects may I notice from receiving this medicine? Side effects that you should report to your doctor or health care professional  as soon as possible:  allergic reactions like skin rash, itching or hives, swelling of the face, lips, or tongue  dark urine  fever  muscle pain, cramps, or weakness  redness, blistering, peeling or loosening of the skin, including inside the mouth  trouble passing urine or change in the amount of urine  unusually weak or tired  yellowing of the eyes or skin Side effects that usually do not require medical attention (report to your doctor or health care professional if they continue or are  bothersome):  gas  headache  heartburn  indigestion  stomach pain This list may not describe all possible side effects. Call your doctor for medical advice about side effects. You may report side effects to FDA at 1-800-FDA-1088. Where should I keep my medicine? Keep out of the reach of children. Store at room temperature between 15 to 30 degrees C (59 to 86 degrees F). Protect from light. Keep container tightly closed. Throw away any unused medicine after the expiration date. NOTE: This sheet is a summary. It may not cover all possible information. If you have questions about this medicine, talk to your doctor, pharmacist, or health care provider.  2021 Elsevier/Gold Standard (2019-11-07 09:58:03)

## 2020-01-05 NOTE — Progress Notes (Addendum)
Chief Complaint  Patient presents with  . Annual Exam   Annual 1. shingrix wanted  2. B/l hearing loss with increased wax b/l tried otc debrox did not help  Review of Systems  Constitutional: Negative for weight loss.  HENT: Positive for hearing loss.   Eyes: Negative for blurred vision.  Respiratory: Negative for shortness of breath.   Cardiovascular: Negative for chest pain.  Gastrointestinal: Negative for abdominal pain.  Musculoskeletal: Negative for falls.  Skin: Negative for rash.  Neurological: Negative for headaches.  Psychiatric/Behavioral: Negative for depression.   Past Medical History:  Diagnosis Date  . Bilateral chronic knee pain   . BRCA negative   . History of abnormal Pap smear   . Hyperlipidemia   . Vitamin D deficiency    Past Surgical History:  Procedure Laterality Date  . BREAST EXCISIONAL BIOPSY Left 2000   benign  . BREAST LUMPECTOMY     bx 2/2 clogged milk duct 1.5 years after nursing daughter   . CESAREAN SECTION  1996  . LEEP     ?2014   Family History  Problem Relation Age of Onset  . Breast cancer Paternal Grandmother 56       deceased at 29  . Cancer Paternal Grandmother        breast  . Breast cancer Paternal Aunt        Paternal/unsure of age  . Cancer Paternal Aunt        breast  . Arthritis Paternal Aunt   . Breast cancer Cousin 37        3 paternal cousin  . Cancer Cousin        p side breast cancer   . Breast cancer Cousin 40       paternal  . Cancer Cousin        paternal side breast ca  . Breast cancer Cousin        paternal  . Diabetes Maternal Grandfather   . Hypertension Father   . Colon polyps Father   . Hyperlipidemia Father   . Colonic polyp Father   . Arthritis Father   . Diabetes Mother   . Diverticulitis Mother   . Cancer Mother        uterine  . Hypertension Mother   . Diabetes Maternal Uncle   . Diabetes Maternal Grandmother   . Bipolar disorder Daughter    Social History   Socioeconomic  History  . Marital status: Single    Spouse name: Not on file  . Number of children: Not on file  . Years of education: Not on file  . Highest education level: Not on file  Occupational History  . Not on file  Tobacco Use  . Smoking status: Never Smoker  . Smokeless tobacco: Never Used  Substance and Sexual Activity  . Alcohol use: No  . Drug use: No  . Sexual activity: Not Currently    Birth control/protection: Pill  Other Topics Concern  . 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 [employer]   Father from [place] grew up in [place]   1 daughter 44 y.o as of 07/01/17    Social Determinants of Health   Financial Resource Strain: Not on file  Food Insecurity: Not on file  Transportation Needs: Not on file  Physical Activity: Not on file  Stress: Not on file  Social Connections: Not on file  Intimate Partner Violence: Not on  file   Current Meds  Medication Sig  . [DISCONTINUED] EPINEPHrine 0.3 mg/0.3 mL IJ SOAJ injection   . [DISCONTINUED] linaclotide (LINZESS) 290 MCG CAPS capsule Take 1 capsule (290 mcg total) by mouth daily before breakfast.   Allergies  Allergen Reactions  . Latex     sensitive  . Other     Nuts tongue swelling  Berries rash   . Shellfish Allergy Hives   No results found for this or any previous visit (from the past 2160 hour(s)). Objective  Body mass index is 36.22 kg/m. Wt Readings from Last 3 Encounters:  01/05/20 238 lb 3.2 oz (108 kg)  02/02/19 226 lb 9.6 oz (102.8 kg)  12/30/18 226 lb 1.6 oz (102.6 kg)   Temp Readings from Last 3 Encounters:  01/05/20 98.4 F (36.9 C) (Oral)  04/01/18 98.3 F (36.8 C) (Oral)  10/01/17 98.3 F (36.8 C) (Oral)   BP Readings from Last 3 Encounters:  01/05/20 116/70  02/02/19 132/80  04/01/18 116/66   Pulse Readings from Last 3 Encounters:  01/05/20 69  02/02/19 72  04/01/18 63    Physical Exam Vitals and nursing note reviewed.  Constitutional:       Appearance: Normal appearance. She is well-developed and well-groomed. She is obese.  HENT:     Head: Normocephalic and atraumatic.     Right Ear: There is impacted cerumen.     Left Ear: There is impacted cerumen.  Eyes:     Conjunctiva/sclera: Conjunctivae normal.     Pupils: Pupils are equal, round, and reactive to light.  Cardiovascular:     Rate and Rhythm: Normal rate and regular rhythm.     Heart sounds: Normal heart sounds. No murmur heard.   Pulmonary:     Effort: Pulmonary effort is normal.     Breath sounds: Normal breath sounds.  Skin:    General: Skin is warm and dry.  Neurological:     General: No focal deficit present.     Mental Status: She is alert and oriented to person, place, and time. Mental status is at baseline.     Gait: Gait normal.  Psychiatric:        Attention and Perception: Attention and perception normal.        Mood and Affect: Mood and affect normal.        Speech: Speech normal.        Behavior: Behavior normal. Behavior is cooperative.        Thought Content: Thought content normal.        Cognition and Memory: Cognition and memory normal.        Judgment: Judgment normal.     Assessment  Plan  Annual physical exam - Plan:  Flu shot utd  Tdaputd shingrixrx x 2 given Rx today 3/3 pfizer Declines hcv/hiv   mammo 12/29/18 negative ordered 12/2019 -BRCA neg disc other options gene testing with OB/GYN  colonoscopy Dr. Mar Daring Mann9/20/19 small 5 polyps hyperplastic f/u in 10 years, IH   FH uterine cancer GM and cousin Pap had 12/04/15 neg neg HPV h/o LEEP f/u OB/GYN  -call Dr. Loni Muse ob/gyn and set up repeat  02/02/19 negative negative hpv due f/u 1 year    rec exercise and healthy eating habits to continue she likes 30 minutes of dance   01/2017 eye exam [place] eye nearsighted, seen 01/2018 consider repeat  [facility] dentist  Q6 months    Bilateral impacted cerumen With hearing loss consented b/l ear  wax removal with  currette tolerated all wax removed   Irritable bowel syndrome with constipation - Plan: linaclotide (LINZESS) 290 MCG CAPS capsule  Anaphylaxis, subsequent encounter - Plan: EPINEPHrine 0.3 mg/0.3 mL IJ SOAJ injection   Provider: Dr. Olivia Mackie McLean-Scocuzza-Internal Medicine

## 2020-01-27 ENCOUNTER — Other Ambulatory Visit (HOSPITAL_COMMUNITY): Payer: Self-pay | Admitting: Dermatology

## 2020-01-27 DIAGNOSIS — L219 Seborrheic dermatitis, unspecified: Secondary | ICD-10-CM | POA: Insufficient documentation

## 2020-01-27 DIAGNOSIS — B079 Viral wart, unspecified: Secondary | ICD-10-CM | POA: Insufficient documentation

## 2020-01-27 DIAGNOSIS — L821 Other seborrheic keratosis: Secondary | ICD-10-CM | POA: Insufficient documentation

## 2020-02-09 DIAGNOSIS — E785 Hyperlipidemia, unspecified: Secondary | ICD-10-CM | POA: Diagnosis not present

## 2020-02-09 DIAGNOSIS — Z1329 Encounter for screening for other suspected endocrine disorder: Secondary | ICD-10-CM | POA: Diagnosis not present

## 2020-02-09 DIAGNOSIS — E559 Vitamin D deficiency, unspecified: Secondary | ICD-10-CM | POA: Diagnosis not present

## 2020-02-09 DIAGNOSIS — Z0000 Encounter for general adult medical examination without abnormal findings: Secondary | ICD-10-CM | POA: Diagnosis not present

## 2020-02-09 DIAGNOSIS — Z1389 Encounter for screening for other disorder: Secondary | ICD-10-CM | POA: Diagnosis not present

## 2020-02-10 ENCOUNTER — Other Ambulatory Visit: Payer: Self-pay | Admitting: Internal Medicine

## 2020-02-10 DIAGNOSIS — E559 Vitamin D deficiency, unspecified: Secondary | ICD-10-CM

## 2020-02-10 LAB — CBC WITH DIFFERENTIAL/PLATELET
Basophils Absolute: 0 10*3/uL (ref 0.0–0.2)
Basos: 1 %
EOS (ABSOLUTE): 0.2 10*3/uL (ref 0.0–0.4)
Eos: 3 %
Hematocrit: 34.8 % (ref 34.0–46.6)
Hemoglobin: 11.1 g/dL (ref 11.1–15.9)
Immature Grans (Abs): 0 10*3/uL (ref 0.0–0.1)
Immature Granulocytes: 0 %
Lymphocytes Absolute: 1.9 10*3/uL (ref 0.7–3.1)
Lymphs: 37 %
MCH: 26.6 pg (ref 26.6–33.0)
MCHC: 31.9 g/dL (ref 31.5–35.7)
MCV: 83 fL (ref 79–97)
Monocytes Absolute: 0.4 10*3/uL (ref 0.1–0.9)
Monocytes: 8 %
Neutrophils Absolute: 2.6 10*3/uL (ref 1.4–7.0)
Neutrophils: 51 %
Platelets: 288 10*3/uL (ref 150–450)
RBC: 4.18 x10E6/uL (ref 3.77–5.28)
RDW: 14.2 % (ref 11.7–15.4)
WBC: 5.1 10*3/uL (ref 3.4–10.8)

## 2020-02-10 LAB — URINALYSIS, ROUTINE W REFLEX MICROSCOPIC
Bilirubin, UA: NEGATIVE
Glucose, UA: NEGATIVE
Ketones, UA: NEGATIVE
Nitrite, UA: NEGATIVE
Protein,UA: NEGATIVE
RBC, UA: NEGATIVE
Specific Gravity, UA: 1.016 (ref 1.005–1.030)
Urobilinogen, Ur: 0.2 mg/dL (ref 0.2–1.0)
pH, UA: 5.5 (ref 5.0–7.5)

## 2020-02-10 LAB — COMPREHENSIVE METABOLIC PANEL
ALT: 22 IU/L (ref 0–32)
AST: 19 IU/L (ref 0–40)
Albumin/Globulin Ratio: 1.1 — ABNORMAL LOW (ref 1.2–2.2)
Albumin: 4 g/dL (ref 3.8–4.9)
Alkaline Phosphatase: 102 IU/L (ref 44–121)
BUN/Creatinine Ratio: 13 (ref 9–23)
BUN: 14 mg/dL (ref 6–24)
Bilirubin Total: 0.4 mg/dL (ref 0.0–1.2)
CO2: 23 mmol/L (ref 20–29)
Calcium: 9.5 mg/dL (ref 8.7–10.2)
Chloride: 103 mmol/L (ref 96–106)
Creatinine, Ser: 1.04 mg/dL — ABNORMAL HIGH (ref 0.57–1.00)
GFR calc Af Amer: 71 mL/min/{1.73_m2} (ref >59)
GFR calc non Af Amer: 61 mL/min/{1.73_m2} (ref >59)
Globulin, Total: 3.6 g/dL (ref 1.5–4.5)
Glucose: 89 mg/dL (ref 65–99)
Potassium: 4.2 mmol/L (ref 3.5–5.2)
Sodium: 140 mmol/L (ref 134–144)
Total Protein: 7.6 g/dL (ref 6.0–8.5)

## 2020-02-10 LAB — LIPID PANEL
Chol/HDL Ratio: 5.7 ratio — ABNORMAL HIGH (ref 0.0–4.4)
Cholesterol, Total: 290 mg/dL — ABNORMAL HIGH (ref 100–199)
HDL: 51 mg/dL (ref >39)
LDL Chol Calc (NIH): 220 mg/dL — ABNORMAL HIGH (ref 0–99)
Triglycerides: 109 mg/dL (ref 0–149)
VLDL Cholesterol Cal: 19 mg/dL (ref 5–40)

## 2020-02-10 LAB — MICROSCOPIC EXAMINATION
Casts: NONE SEEN /lpf
Epithelial Cells (non renal): >10 /hpf — AB (ref 0–10)

## 2020-02-10 LAB — VITAMIN D 25 HYDROXY (VIT D DEFICIENCY, FRACTURES): Vit D, 25-Hydroxy: 18.1 ng/mL — ABNORMAL LOW (ref 30.0–100.0)

## 2020-02-10 LAB — TSH: TSH: 2.2 u[IU]/mL (ref 0.450–4.500)

## 2020-02-10 MED ORDER — CHOLECALCIFEROL 1.25 MG (50000 UT) PO CAPS: 50000.0000 [IU] | ORAL_CAPSULE | ORAL | 1 refills | Status: DC

## 2020-02-15 ENCOUNTER — Encounter: Payer: Self-pay | Admitting: Internal Medicine

## 2020-02-15 ENCOUNTER — Other Ambulatory Visit: Payer: Self-pay | Admitting: Internal Medicine

## 2020-02-15 MED ORDER — PRAVASTATIN SODIUM 20 MG PO TABS: 20.0000 mg | ORAL_TABLET | Freq: Every day | ORAL | 3 refills | Status: DC

## 2020-02-15 NOTE — Addendum Note (Signed)
Addended by: Orland Mustard on: 02/15/2020 05:29 PM   Modules accepted: Orders

## 2020-03-27 ENCOUNTER — Inpatient Hospital Stay: Admission: RE | Admit: 2020-03-27 | Payer: 59 | Source: Ambulatory Visit

## 2020-03-28 ENCOUNTER — Other Ambulatory Visit (HOSPITAL_BASED_OUTPATIENT_CLINIC_OR_DEPARTMENT_OTHER): Payer: Self-pay

## 2020-03-30 ENCOUNTER — Other Ambulatory Visit: Payer: Self-pay

## 2020-03-30 ENCOUNTER — Ambulatory Visit
Admission: RE | Admit: 2020-03-30 | Discharge: 2020-03-30 | Disposition: A | Discharge status: Home / Self Care | Payer: 59 | Source: Ambulatory Visit | Attending: Internal Medicine | Admitting: Internal Medicine

## 2020-03-30 DIAGNOSIS — Z1231 Encounter for screening mammogram for malignant neoplasm of breast: Secondary | ICD-10-CM | POA: Diagnosis not present

## 2020-04-10 ENCOUNTER — Other Ambulatory Visit (HOSPITAL_COMMUNITY): Payer: Self-pay

## 2020-04-13 ENCOUNTER — Other Ambulatory Visit (HOSPITAL_COMMUNITY): Payer: Self-pay

## 2020-04-13 MED FILL — Linaclotide Cap 290 MCG: ORAL | 90 days supply | Qty: 90 | Fill #0 | Status: AC

## 2020-04-14 ENCOUNTER — Other Ambulatory Visit (HOSPITAL_COMMUNITY): Payer: Self-pay

## 2020-04-14 MED FILL — Cholecalciferol Cap 1.25 MG (50000 Unit): ORAL | 90 days supply | Qty: 13 | Fill #0 | Status: AC

## 2020-04-15 ENCOUNTER — Other Ambulatory Visit (HOSPITAL_COMMUNITY): Payer: Self-pay

## 2020-05-09 DIAGNOSIS — M7551 Bursitis of right shoulder: Secondary | ICD-10-CM | POA: Diagnosis not present

## 2020-05-13 MED FILL — Pravastatin Sodium Tab 20 MG: ORAL | 90 days supply | Qty: 90 | Fill #0 | Status: AC

## 2020-05-15 ENCOUNTER — Other Ambulatory Visit (HOSPITAL_COMMUNITY): Payer: Self-pay

## 2020-05-17 ENCOUNTER — Ambulatory Visit: Payer: 59

## 2020-06-29 DIAGNOSIS — E785 Hyperlipidemia, unspecified: Secondary | ICD-10-CM | POA: Diagnosis not present

## 2020-06-29 DIAGNOSIS — E559 Vitamin D deficiency, unspecified: Secondary | ICD-10-CM | POA: Diagnosis not present

## 2020-06-30 LAB — COMPREHENSIVE METABOLIC PANEL
ALT: 31 IU/L (ref 0–32)
AST: 22 IU/L (ref 0–40)
Albumin/Globulin Ratio: 1.3 (ref 1.2–2.2)
Albumin: 4.1 g/dL (ref 3.8–4.9)
Alkaline Phosphatase: 93 IU/L (ref 44–121)
BUN/Creatinine Ratio: 15 (ref 9–23)
BUN: 15 mg/dL (ref 6–24)
Bilirubin Total: 0.3 mg/dL (ref 0.0–1.2)
CO2: 20 mmol/L (ref 20–29)
Calcium: 9.9 mg/dL (ref 8.7–10.2)
Chloride: 106 mmol/L (ref 96–106)
Creatinine, Ser: 1.02 mg/dL — ABNORMAL HIGH (ref 0.57–1.00)
Globulin, Total: 3.2 g/dL (ref 1.5–4.5)
Glucose: 92 mg/dL (ref 65–99)
Potassium: 4.3 mmol/L (ref 3.5–5.2)
Sodium: 140 mmol/L (ref 134–144)
Total Protein: 7.3 g/dL (ref 6.0–8.5)
eGFR: 65 mL/min/{1.73_m2} (ref >59)

## 2020-06-30 LAB — LIPID PANEL
Chol/HDL Ratio: 4.1 ratio (ref 0.0–4.4)
Cholesterol, Total: 235 mg/dL — ABNORMAL HIGH (ref 100–199)
HDL: 58 mg/dL (ref >39)
LDL Chol Calc (NIH): 151 mg/dL — ABNORMAL HIGH (ref 0–99)
Triglycerides: 144 mg/dL (ref 0–149)
VLDL Cholesterol Cal: 26 mg/dL (ref 5–40)

## 2020-06-30 LAB — VITAMIN D 25 HYDROXY (VIT D DEFICIENCY, FRACTURES): Vit D, 25-Hydroxy: 53.7 ng/mL (ref 30.0–100.0)

## 2020-07-11 MED FILL — Hydrocortisone Cream 2.5%: CUTANEOUS | 14 days supply | Qty: 30 | Fill #0 | Status: AC

## 2020-07-11 MED FILL — Linaclotide Cap 290 MCG: ORAL | 90 days supply | Qty: 90 | Fill #1 | Status: AC

## 2020-07-12 ENCOUNTER — Other Ambulatory Visit (HOSPITAL_COMMUNITY): Payer: Self-pay

## 2020-07-14 MED FILL — Cholecalciferol Cap 1.25 MG (50000 Unit): ORAL | 90 days supply | Qty: 13 | Fill #1 | Status: CN

## 2020-07-15 ENCOUNTER — Other Ambulatory Visit (HOSPITAL_COMMUNITY): Payer: Self-pay

## 2020-07-17 ENCOUNTER — Other Ambulatory Visit: Payer: Self-pay | Admitting: Internal Medicine

## 2020-07-17 ENCOUNTER — Other Ambulatory Visit (HOSPITAL_COMMUNITY): Payer: Self-pay

## 2020-07-17 MED ORDER — VITAMIN D3 1.25 MG (50000 UT) PO CAPS
1.0000 | ORAL_CAPSULE | ORAL | 1 refills | Status: DC
  Filled 2020-07-17: qty 13, 90d supply, fill #0
  Filled 2020-10-09: qty 13, 90d supply, fill #1

## 2020-07-17 MED FILL — Cholecalciferol Cap 1.25 MG (50000 Unit): ORAL | 90 days supply | Qty: 13 | Fill #1 | Status: CN

## 2020-08-10 ENCOUNTER — Other Ambulatory Visit (HOSPITAL_COMMUNITY): Payer: Self-pay

## 2020-08-10 DIAGNOSIS — L219 Seborrheic dermatitis, unspecified: Secondary | ICD-10-CM | POA: Diagnosis not present

## 2020-08-10 MED ORDER — BETAMETHASONE DIPROPIONATE AUG 0.05 % EX LOTN
TOPICAL_LOTION | CUTANEOUS | 3 refills | Status: AC
  Filled 2020-08-10: qty 60, 20d supply, fill #0
  Filled 2021-04-06: qty 60, 20d supply, fill #1

## 2020-08-11 ENCOUNTER — Other Ambulatory Visit (HOSPITAL_COMMUNITY): Payer: Self-pay

## 2020-08-23 MED FILL — Pravastatin Sodium Tab 20 MG: ORAL | 90 days supply | Qty: 90 | Fill #1 | Status: AC

## 2020-08-24 ENCOUNTER — Other Ambulatory Visit (HOSPITAL_COMMUNITY): Payer: Self-pay

## 2020-10-05 ENCOUNTER — Other Ambulatory Visit (HOSPITAL_COMMUNITY): Payer: Self-pay

## 2020-10-05 DIAGNOSIS — S83002A Unspecified subluxation of left patella, initial encounter: Secondary | ICD-10-CM | POA: Diagnosis not present

## 2020-10-05 DIAGNOSIS — M25571 Pain in right ankle and joints of right foot: Secondary | ICD-10-CM | POA: Diagnosis not present

## 2020-10-05 DIAGNOSIS — M1712 Unilateral primary osteoarthritis, left knee: Secondary | ICD-10-CM | POA: Diagnosis not present

## 2020-10-05 MED ORDER — NABUMETONE 500 MG PO TABS
ORAL_TABLET | ORAL | 1 refills | Status: DC
  Filled 2020-10-05: qty 60, 30d supply, fill #0

## 2020-10-09 MED FILL — Linaclotide Cap 290 MCG: ORAL | 90 days supply | Qty: 90 | Fill #2 | Status: AC

## 2020-10-10 ENCOUNTER — Other Ambulatory Visit (HOSPITAL_COMMUNITY): Payer: Self-pay

## 2020-10-31 DIAGNOSIS — M76821 Posterior tibial tendinitis, right leg: Secondary | ICD-10-CM | POA: Diagnosis not present

## 2020-10-31 DIAGNOSIS — S83242A Other tear of medial meniscus, current injury, left knee, initial encounter: Secondary | ICD-10-CM | POA: Diagnosis not present

## 2020-11-08 DIAGNOSIS — M779 Enthesopathy, unspecified: Secondary | ICD-10-CM | POA: Insufficient documentation

## 2020-11-08 DIAGNOSIS — M76821 Posterior tibial tendinitis, right leg: Secondary | ICD-10-CM | POA: Diagnosis not present

## 2020-11-08 DIAGNOSIS — M25571 Pain in right ankle and joints of right foot: Secondary | ICD-10-CM | POA: Diagnosis not present

## 2020-11-08 DIAGNOSIS — M25871 Other specified joint disorders, right ankle and foot: Secondary | ICD-10-CM | POA: Insufficient documentation

## 2020-11-21 DIAGNOSIS — M25571 Pain in right ankle and joints of right foot: Secondary | ICD-10-CM | POA: Diagnosis not present

## 2020-11-27 DIAGNOSIS — M76821 Posterior tibial tendinitis, right leg: Secondary | ICD-10-CM | POA: Diagnosis not present

## 2020-11-27 DIAGNOSIS — M6701 Short Achilles tendon (acquired), right ankle: Secondary | ICD-10-CM | POA: Diagnosis not present

## 2020-12-20 DIAGNOSIS — M76821 Posterior tibial tendinitis, right leg: Secondary | ICD-10-CM | POA: Diagnosis not present

## 2020-12-20 DIAGNOSIS — M25571 Pain in right ankle and joints of right foot: Secondary | ICD-10-CM | POA: Diagnosis not present

## 2021-01-05 ENCOUNTER — Other Ambulatory Visit (HOSPITAL_COMMUNITY): Payer: Self-pay

## 2021-01-05 ENCOUNTER — Ambulatory Visit (INDEPENDENT_AMBULATORY_CARE_PROVIDER_SITE_OTHER): Payer: 59 | Admitting: Internal Medicine

## 2021-01-05 ENCOUNTER — Encounter: Payer: Self-pay | Admitting: Internal Medicine

## 2021-01-05 ENCOUNTER — Other Ambulatory Visit: Payer: Self-pay

## 2021-01-05 VITALS — BP 128/72 | HR 60 | Temp 97.0°F | Resp 16 | Ht 68.0 in | Wt 240.8 lb

## 2021-01-05 DIAGNOSIS — E538 Deficiency of other specified B group vitamins: Secondary | ICD-10-CM | POA: Diagnosis not present

## 2021-01-05 DIAGNOSIS — Z1231 Encounter for screening mammogram for malignant neoplasm of breast: Secondary | ICD-10-CM | POA: Diagnosis not present

## 2021-01-05 DIAGNOSIS — E559 Vitamin D deficiency, unspecified: Secondary | ICD-10-CM

## 2021-01-05 DIAGNOSIS — I77810 Thoracic aortic ectasia: Secondary | ICD-10-CM

## 2021-01-05 DIAGNOSIS — R7303 Prediabetes: Secondary | ICD-10-CM | POA: Insufficient documentation

## 2021-01-05 DIAGNOSIS — E785 Hyperlipidemia, unspecified: Secondary | ICD-10-CM

## 2021-01-05 DIAGNOSIS — R739 Hyperglycemia, unspecified: Secondary | ICD-10-CM

## 2021-01-05 DIAGNOSIS — Z0000 Encounter for general adult medical examination without abnormal findings: Secondary | ICD-10-CM | POA: Diagnosis not present

## 2021-01-05 DIAGNOSIS — Z1329 Encounter for screening for other suspected endocrine disorder: Secondary | ICD-10-CM | POA: Diagnosis not present

## 2021-01-05 DIAGNOSIS — K581 Irritable bowel syndrome with constipation: Secondary | ICD-10-CM | POA: Diagnosis not present

## 2021-01-05 LAB — LIPID PANEL
Cholesterol: 222 mg/dL — ABNORMAL HIGH (ref 0–200)
HDL: 47.8 mg/dL (ref >39.00)
LDL Cholesterol: 145 mg/dL — ABNORMAL HIGH (ref 0–99)
NonHDL: 174.31
Total CHOL/HDL Ratio: 5
Triglycerides: 145 mg/dL (ref 0.0–149.0)
VLDL: 29 mg/dL (ref 0.0–40.0)

## 2021-01-05 LAB — COMPREHENSIVE METABOLIC PANEL
ALT: 22 U/L (ref 0–35)
AST: 22 U/L (ref 0–37)
Albumin: 3.8 g/dL (ref 3.5–5.2)
Alkaline Phosphatase: 75 U/L (ref 39–117)
BUN: 12 mg/dL (ref 6–23)
CO2: 30 mEq/L (ref 19–32)
Calcium: 9.6 mg/dL (ref 8.4–10.5)
Chloride: 106 mEq/L (ref 96–112)
Creatinine, Ser: 0.97 mg/dL (ref 0.40–1.20)
GFR: 66.2 mL/min (ref >60.00)
Glucose, Bld: 89 mg/dL (ref 70–99)
Potassium: 4.5 mEq/L (ref 3.5–5.1)
Sodium: 141 mEq/L (ref 135–145)
Total Bilirubin: 0.7 mg/dL (ref 0.2–1.2)
Total Protein: 7.2 g/dL (ref 6.0–8.3)

## 2021-01-05 LAB — CBC WITH DIFFERENTIAL/PLATELET
Basophils Absolute: 0 10*3/uL (ref 0.0–0.1)
Basophils Relative: 0.6 % (ref 0.0–3.0)
Eosinophils Absolute: 0.1 10*3/uL (ref 0.0–0.7)
Eosinophils Relative: 3.4 % (ref 0.0–5.0)
HCT: 38.5 % (ref 36.0–46.0)
Hemoglobin: 12.6 g/dL (ref 12.0–15.0)
Lymphocytes Relative: 34.4 % (ref 12.0–46.0)
Lymphs Abs: 1.3 10*3/uL (ref 0.7–4.0)
MCHC: 32.8 g/dL (ref 30.0–36.0)
MCV: 89.1 fl (ref 78.0–100.0)
Monocytes Absolute: 0.3 10*3/uL (ref 0.1–1.0)
Monocytes Relative: 7.9 % (ref 3.0–12.0)
Neutro Abs: 2 10*3/uL (ref 1.4–7.7)
Neutrophils Relative %: 53.7 % (ref 43.0–77.0)
Platelets: 190 10*3/uL (ref 150.0–400.0)
RBC: 4.32 Mil/uL (ref 3.87–5.11)
RDW: 14.8 % (ref 11.5–15.5)
WBC: 3.8 10*3/uL — ABNORMAL LOW (ref 4.0–10.5)

## 2021-01-05 LAB — HEMOGLOBIN A1C: Hgb A1c MFr Bld: 5.7 % (ref 4.6–6.5)

## 2021-01-05 LAB — TSH: TSH: 1.41 u[IU]/mL (ref 0.35–5.50)

## 2021-01-05 LAB — VITAMIN D 25 HYDROXY (VIT D DEFICIENCY, FRACTURES): VITD: 74.11 ng/mL (ref 30.00–100.00)

## 2021-01-05 LAB — VITAMIN B12: Vitamin B-12: 149 pg/mL — ABNORMAL LOW (ref 211–911)

## 2021-01-05 MED ORDER — LINACLOTIDE 290 MCG PO CAPS
ORAL_CAPSULE | ORAL | 3 refills | Status: DC
  Filled 2021-01-05: qty 90, 90d supply, fill #0
  Filled 2021-05-17: qty 90, 90d supply, fill #1
  Filled 2021-09-03: qty 90, 90d supply, fill #2
  Filled 2021-11-28: qty 90, 90d supply, fill #3

## 2021-01-05 MED ORDER — PRAVASTATIN SODIUM 20 MG PO TABS
ORAL_TABLET | Freq: Every day | ORAL | 3 refills | Status: DC
  Filled 2021-01-05: qty 90, 90d supply, fill #0
  Filled 2021-04-17: qty 90, 90d supply, fill #1
  Filled 2021-08-09: qty 90, 90d supply, fill #2

## 2021-01-05 NOTE — Patient Instructions (Signed)
Dr. [facility]

## 2021-01-05 NOTE — Progress Notes (Signed)
Chief Complaint  Patient presents with   Annual Exam   Annual  1. Hld on pravachol 20 mg qhs  2. Weight gain though eating right wants to know if could be due to above  3. Constipation needs linzess  4. Foot issue 2008-03-29 Dr. Jenny Reichmann hewitt right foot pending due to issues since 29-Mar-2008   Review of Systems  Constitutional:  Negative for weight loss.  HENT:  Negative for hearing loss.   Eyes:  Negative for blurred vision.  Respiratory:  Negative for shortness of breath.   Cardiovascular:  Negative for chest pain.  Gastrointestinal:  Negative for abdominal pain and blood in stool.  Genitourinary:  Negative for dysuria.  Musculoskeletal:  Negative for falls and joint pain.  Skin:  Negative for rash.  Neurological:  Negative for headaches.  Psychiatric/Behavioral:  Negative for depression.   Past Medical History:  Diagnosis Date   Bilateral chronic knee pain    BRCA negative    History of abnormal Pap smear    Hyperlipidemia    Vitamin D deficiency    Past Surgical History:  Procedure Laterality Date   BREAST EXCISIONAL BIOPSY Left 03-29-1998   benign   BREAST LUMPECTOMY     bx 2/2 clogged milk duct 1.5 years after nursing daughter    [place]   LEEP     ?2012-03-29   Family History  Problem Relation Age of Onset   Uterine cancer Mother    Diabetes Mother    Diverticulitis Mother    Cancer Mother        uterine   Hypertension Mother    Hypertension Father    Colon polyps Father    Hyperlipidemia Father    Colonic polyp Father    Arthritis Father    Diabetes Maternal Grandmother    Diabetes Maternal Grandfather    Breast cancer Paternal Grandmother 29       deceased at 14   Cancer Paternal Grandmother        breast   Bipolar disorder Daughter    Diabetes Maternal Uncle    Breast cancer Paternal 47        Paternal/unsure of age   34 Paternal 47        breast   Arthritis Paternal Aunt    Breast cancer Cousin 53        3 paternal cousin   Cancer Cousin         p side breast cancer    Breast cancer Cousin 23       paternal   Cancer Cousin        paternal side breast ca   Breast cancer Cousin        paternal   Uterine cancer Cousin        died age 5 03-29-20   Social History   Socioeconomic History   Marital status: Single    Spouse name: Not on file   Number of children: Not on file   Years of education: Not on file   Highest education level: Not on file  Occupational History   Not on file  Tobacco Use   Smoking status: Never   Smokeless tobacco: Never  Substance and Sexual Activity   Alcohol use: No   Drug use: No   Sexual activity: Not Currently    Birth control/protection: Pill  Other Topics Concern   Not on file  Social History Narrative   Single    Bachelors degree  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 [employer]   Father from [place] grew up in [place]   1 daughter 70 y.o as of 07/01/17    Social Determinants of Health   Financial Resource Strain: Not on file  Food Insecurity: Not on file  Transportation Needs: Not on file  Physical Activity: Not on file  Stress: Not on file  Social Connections: Not on file  Intimate Partner Violence: Not on file   No outpatient medications have been marked as taking for the 01/05/21 encounter (Office Visit) with McLean-Scocuzza, Nino Glow, MD.   Allergies  Allergen Reactions   Latex     sensitive   Other     Nuts tongue swelling  Berries rash    Shellfish Allergy Hives   No results found for this or any previous visit (from the past 2160 hour(s)). Objective  Body mass index is 36.61 kg/m. Wt Readings from Last 3 Encounters:  01/05/21 240 lb 12.8 oz (109.2 kg)  01/05/20 238 lb 3.2 oz (108 kg)  02/02/19 226 lb 9.6 oz (102.8 kg)   Temp Readings from Last 3 Encounters:  01/05/21 (!) 97 F (36.1 C)  01/05/20 98.4 F (36.9 C) (Oral)  04/01/18 98.3 F (36.8 C) (Oral)   BP Readings from Last 3 Encounters:  01/05/21 128/72  01/05/20 116/70  02/02/19 132/80   Pulse Readings from Last 3  Encounters:  01/05/21 60  01/05/20 69  02/02/19 72    Physical Exam Vitals and nursing note reviewed.  Constitutional:      Appearance: Normal appearance. She is well-developed and well-groomed.  HENT:     Head: Normocephalic and atraumatic.  Eyes:     Conjunctiva/sclera: Conjunctivae normal.     Pupils: Pupils are equal, round, and reactive to light.  Cardiovascular:     Rate and Rhythm: Normal rate and regular rhythm.     Heart sounds: Normal heart sounds. No murmur heard. Pulmonary:     Effort: Pulmonary effort is normal.     Breath sounds: Normal breath sounds.  Abdominal:     General: Abdomen is flat. Bowel sounds are normal.     Tenderness: There is no abdominal tenderness.  Musculoskeletal:        General: No tenderness.  Skin:    General: Skin is warm and dry.  Neurological:     General: No focal deficit present.     Mental Status: She is alert and oriented to person, place, and time. Mental status is at baseline.     Cranial Nerves: Cranial nerves 2-12 are intact.     Gait: Gait is intact.  Psychiatric:        Attention and Perception: Attention and perception normal.        Mood and Affect: Mood and affect normal.        Speech: Speech normal.        Behavior: Behavior normal. Behavior is cooperative.        Thought Content: Thought content normal.        Cognition and Memory: Cognition and memory normal.        Judgment: Judgment normal.    Assessment  Plan  Annual physical exam - Plan: Comprehensive metabolic panel, Lipid panel, CBC w/Diff, Vitamin D (25 hydroxy), Hemoglobin A1c, Vitamin B12, TSH See below   Irritable bowel syndrome with constipation - Plan: linaclotide (LINZESS) 290 MCG CAPS capsule, Lipid panel  Hyperlipidemia, unspecified hyperlipidemia type Pravachol 20 mg qhs     Flu shot utd  Tdap utd shingrix  rx 2/2  3/3 pfizerconsider 4th Declines hcv/hiv    mammo 12/29/18 negative ordered 12/2019 -BRCA neg disc other options gene  testing with OB/GYN 03/30/20   colonoscopy Dr. Juanita Craver 09/26/17 small 5 polyps hyperplastic f/u in 10 years, IH    FH uterine cancer mom and cousin (died age 51 03-05-2020)  Pap had 12-10-2015 neg neg HPV h/o LEEP f/u OB/GYN  -call Dr. Loni Muse ob/gyn and set up repeat   02/02/19 negative negative hpv due f/u 1 year      rec exercise and healthy eating habits to continue she likes 30 minutes of dance    01/2017 eye exam [place] eye nearsighted, seen 01/2018 consider repeat   [facility] dentist  Q6 months      Provider: Dr. Olivia Mackie McLean-Scocuzza-Internal Medicine

## 2021-01-07 ENCOUNTER — Encounter: Payer: Self-pay | Admitting: Internal Medicine

## 2021-01-09 ENCOUNTER — Other Ambulatory Visit: Payer: Self-pay | Admitting: Internal Medicine

## 2021-01-09 ENCOUNTER — Other Ambulatory Visit (HOSPITAL_COMMUNITY): Payer: Self-pay

## 2021-01-09 DIAGNOSIS — M6701 Short Achilles tendon (acquired), right ankle: Secondary | ICD-10-CM | POA: Diagnosis not present

## 2021-01-09 DIAGNOSIS — M216X1 Other acquired deformities of right foot: Secondary | ICD-10-CM | POA: Diagnosis not present

## 2021-01-09 DIAGNOSIS — M76821 Posterior tibial tendinitis, right leg: Secondary | ICD-10-CM | POA: Diagnosis not present

## 2021-01-09 DIAGNOSIS — G8918 Other acute postprocedural pain: Secondary | ICD-10-CM | POA: Diagnosis not present

## 2021-01-09 DIAGNOSIS — M659 Synovitis and tenosynovitis, unspecified: Secondary | ICD-10-CM | POA: Diagnosis not present

## 2021-01-09 MED ORDER — VITAMIN D3 1.25 MG (50000 UT) PO CAPS
1.0000 | ORAL_CAPSULE | ORAL | 1 refills | Status: DC
  Filled 2021-01-09: qty 13, 90d supply, fill #0
  Filled 2021-04-06: qty 13, 90d supply, fill #1

## 2021-01-09 MED ORDER — OXYCODONE HCL 5 MG PO TABS
ORAL_TABLET | ORAL | 0 refills | Status: DC
  Filled 2021-01-09: qty 18, 3d supply, fill #0

## 2021-01-09 MED ORDER — XARELTO 10 MG PO TABS
ORAL_TABLET | ORAL | 0 refills | Status: DC
  Filled 2021-01-09: qty 14, 14d supply, fill #0

## 2021-01-09 NOTE — Telephone Encounter (Signed)
Patient vitamin D last checked 01/05/21. Okay to send in high dose vitamin D?

## 2021-01-10 NOTE — Addendum Note (Signed)
Addended by: Orland Mustard on: 01/10/2021 05:26 PM   Modules accepted: Orders

## 2021-01-22 DIAGNOSIS — M76821 Posterior tibial tendinitis, right leg: Secondary | ICD-10-CM | POA: Diagnosis not present

## 2021-01-22 DIAGNOSIS — Z4889 Encounter for other specified surgical aftercare: Secondary | ICD-10-CM | POA: Diagnosis not present

## 2021-02-22 DIAGNOSIS — M79671 Pain in right foot: Secondary | ICD-10-CM | POA: Diagnosis not present

## 2021-02-22 DIAGNOSIS — Z4889 Encounter for other specified surgical aftercare: Secondary | ICD-10-CM | POA: Diagnosis not present

## 2021-02-22 DIAGNOSIS — M76821 Posterior tibial tendinitis, right leg: Secondary | ICD-10-CM | POA: Diagnosis not present

## 2021-02-28 ENCOUNTER — Telehealth: Payer: 59 | Admitting: Physician Assistant

## 2021-02-28 DIAGNOSIS — S91001D Unspecified open wound, right ankle, subsequent encounter: Secondary | ICD-10-CM

## 2021-02-28 MED ORDER — DOXYCYCLINE HYCLATE 100 MG PO TABS
100.0000 mg | ORAL_TABLET | Freq: Two times a day (BID) | ORAL | 0 refills | Status: DC
Start: 2021-02-28 — End: 2021-09-21

## 2021-02-28 NOTE — Progress Notes (Signed)
Virtual Visit Consent   [name], you are scheduled for a virtual visit with a [facility] provider today.     Just as with appointments in the office, your consent must be obtained to participate.  Your consent will be active for this visit and any virtual visit you may have with one of our providers in the next 365 days.     If you have a MyChart account, a copy of this consent can be sent to you electronically.  All virtual visits are billed to your insurance company just like a traditional visit in the office.    As this is a virtual visit, video technology does not allow for your provider to perform a traditional examination.  This may limit your provider's ability to fully assess your condition.  If your provider identifies any concerns that need to be evaluated in person or the need to arrange testing (such as labs, EKG, etc.), we will make arrangements to do so.     Although advances in technology are sophisticated, we cannot ensure that it will always work on either your end or our end.  If the connection with a video visit is poor, the visit may have to be switched to a telephone visit.  With either a video or telephone visit, we are not always able to ensure that we have a secure connection.     I need to obtain your verbal consent now.   Are you willing to proceed with your visit today?    [name] has provided verbal consent on 02/28/2021 for a virtual visit (video or telephone).   [name], [place]   Date: 02/28/2021 7:41 PM   Virtual Visit via Video Note   I, [name], connected with  [name]  ([id number], 01/24/1965) on 02/28/21 at  7:30 PM EST by a video-enabled telemedicine application and verified that I am speaking with the correct person using two identifiers.  Location: Patient: Virtual Visit Location Patient: Home Provider: Virtual Visit Location Provider: Home Office   I discussed the limitations of evaluation and  management by telemedicine and the availability of in person appointments. The patient expressed understanding and agreed to proceed.    History of Present Illness: [name] is a 56 y.o. who identifies as a female who was assigned female at birth, and is being seen today for possible infection in surgical wound. Notes back on 1/3 had podiatric surgery on her R foot, stretching the achilles, etc. Was then placed in hard cast and transitioned into more of a soft case. Notes last week on 2/16, the orthopedist removed the cast. Notes after she got home she noted two wounds that were still open. As such she went back last Friday for evaluation and was given wound care instructions and taken off of weight-bearing. Notes her provider asked her to send an updated picture this Monday. Notes that she did but was told by office the provider was out this week. She notes that one wound closed up and is scabbing (medial ankle) Other wound seemed to be closing up but now has become red and with purulent drainage. Denies fever, chills. Denies swelling. Does note it feels  hot to touch but increased pain and in surrounding skin.    HPI: HPI  Problems:  Patient Active Problem List   Diagnosis Date Noted   B12 deficiency 01/05/2021   Prediabetes 01/05/2021   Bilateral impacted cerumen 01/05/2020   Annual physical exam 12/30/2018  Hyperlipidemia 10/01/2017   Elevated liver enzymes 10/01/2017   Irritable bowel syndrome with constipation 10/01/2017   Polyp of colon 10/01/2017   Vitamin D deficiency 10/01/2017   Obesity (BMI 35.0-39.9 without comorbidity) 07/01/2017   Chronic constipation 07/01/2017   Multiple food allergies 07/01/2017   CIN III (cervical intraepithelial neoplasia III) 10/29/2010   HGSIL on Pap smear of cervix 08/09/2010    Allergies:  Allergies  Allergen Reactions   Latex     sensitive   Other     Nuts tongue swelling  Berries rash    Shellfish Allergy Hives   Medications:   Current Outpatient Medications:    doxycycline (VIBRA-TABS) 100 MG tablet, Take 1 tablet (100 mg total) by mouth 2 (two) times daily., Disp: 20 tablet, Rfl: 0   betamethasone, augmented, (DIPROLENE) 0.05 % lotion, Apply to scalp 3 times per week as needed for itching., Disp: 60 mL, Rfl: 3   Cholecalciferol (VITAMIN D3) 1.25 MG (50000 UT) CAPS, TAKE 1 CAPSULE BY MOUTH ONCE A WEEK, Disp: 13 capsule, Rfl: 1   linaclotide (LINZESS) 290 MCG CAPS capsule, TAKE 1 CAPSULE BY MOUTH ONCE DAILY BEFORE BREAKFAST, Disp: 90 capsule, Rfl: 3   nabumetone (RELAFEN) 500 MG tablet, Take 1 tablet by mouth twice a day with food as needed for pain., Disp: 60 tablet, Rfl: 1   oxyCODONE (OXY IR/ROXICODONE) 5 MG immediate release tablet, Take 1 tablet by mouth every 4 hours as needed, Disp: 18 tablet, Rfl: 0   pravastatin (PRAVACHOL) 20 MG tablet, TAKE 1 TABLET BY MOUTH IN THE EVENING, Disp: 90 tablet, Rfl: 3   rivaroxaban (XARELTO) 10 MG TABS tablet, Take 1 tablet by mouth once daily, Disp: 14 tablet, Rfl: 0  Observations/Objective: Patient is well-developed, well-nourished in no acute distress.  Resting comfortably at home.  Head is normocephalic, atraumatic.  No labored breathing. Speech is clear and coherent with logical content.  Patient is alert and oriented at baseline.  R foot examined via video. Just superior and posterior to medial malleolus, there is a closed wound about 4-5 cm in diameter with noted scabbing. Evidence of previous betadine staining observed. No drainage, redness or swelling noted. Partially overlying her lateral malleolus there is a 3-4 cm liner wound that is partially scabbed but open near the anterior end with drainage. Some surrounding redness noted. This area is tender per patient with hardened skin.   Assessment and Plan: 1. Wound of right ankle, subsequent encounter - doxycycline (VIBRA-TABS) 100 MG tablet; Take 1 tablet (100 mg total) by mouth 2 (two) times daily.  Dispense: 20  tablet; Refill: 0  Continue wound care per specialist. No alarm signs or symptoms. Will start Doxycycline. She is to follow-up with [facility] (on-call provider) or her PCP if anything worsening until her specialist returns. Ortho UC information given as well. ER precautions reviewed.   Follow Up Instructions: I discussed the assessment and treatment plan with the patient. The patient was provided an opportunity to ask questions and all were answered. The patient agreed with the plan and demonstrated an understanding of the instructions.  A copy of instructions were sent to the patient via MyChart unless otherwise noted below.   The patient was advised to call back or seek an in-person evaluation if the symptoms worsen or if the condition fails to improve as anticipated.  Time:  I spent 12 minutes with the patient via telehealth technology discussing the above problems/concerns.    Patricia Rio, PA-C

## 2021-02-28 NOTE — Patient Instructions (Signed)
°  Tawny Hopping, thank you for joining Leeanne Rio, PA-C for today's virtual visit.  While this provider is not your primary care provider (PCP), if your PCP is located in our provider database this encounter information will be shared with them immediately following your visit.  Consent: (Patient) [name] provided verbal consent for this virtual visit at the beginning of the encounter.  Current Medications:  Current Outpatient Medications:    betamethasone, augmented, (DIPROLENE) 0.05 % lotion, Apply to scalp 3 times per week as needed for itching., Disp: 60 mL, Rfl: 3   Cholecalciferol (VITAMIN D3) 1.25 MG (50000 UT) CAPS, TAKE 1 CAPSULE BY MOUTH ONCE A WEEK, Disp: 13 capsule, Rfl: 1   linaclotide (LINZESS) 290 MCG CAPS capsule, TAKE 1 CAPSULE BY MOUTH ONCE DAILY BEFORE BREAKFAST, Disp: 90 capsule, Rfl: 3   nabumetone (RELAFEN) 500 MG tablet, Take 1 tablet by mouth twice a day with food as needed for pain., Disp: 60 tablet, Rfl: 1   oxyCODONE (OXY IR/ROXICODONE) 5 MG immediate release tablet, Take 1 tablet by mouth every 4 hours as needed, Disp: 18 tablet, Rfl: 0   pravastatin (PRAVACHOL) 20 MG tablet, TAKE 1 TABLET BY MOUTH IN THE EVENING, Disp: 90 tablet, Rfl: 3   rivaroxaban (XARELTO) 10 MG TABS tablet, Take 1 tablet by mouth once daily, Disp: 14 tablet, Rfl: 0   Medications ordered in this encounter:  No orders of the defined types were placed in this encounter.    *If you need refills on other medications prior to your next appointment, please contact your pharmacy*  Follow-Up: Call back or seek an in-person evaluation if the symptoms worsen or if the condition fails to improve as anticipated.  Other Instructions Please continue wound care as directed by your specialist.  The area of concern is overlying bony prominences so is easier to get pulled on and take longer to heal.  Giving the newer symptoms concerning for starting infection, take the antibiotic  as directed.  Make sure to reach out to the Orthopedic Office or your PCP if you note anything worsening despite treatment so they can get you in with an available provider.  Any severe pain, swelling or new onset fever -- ER.      If you have been instructed to have an in-person evaluation today at a local Urgent Care facility, please use the link below. It will take you to a list of all of our available [facility], including address, phone number and hours of operation. Please do not delay care.  [facility]  If you or a family member do not have a primary care provider, use the link below to schedule a visit and establish care. When you choose a [facility] primary care physician or advanced practice provider, you gain a long-term partner in health. Find a Primary Care Provider  Learn more about [facility]'s in-office and virtual care options: [facility]

## 2021-03-21 DIAGNOSIS — M79671 Pain in right foot: Secondary | ICD-10-CM | POA: Diagnosis not present

## 2021-03-21 DIAGNOSIS — Z4889 Encounter for other specified surgical aftercare: Secondary | ICD-10-CM | POA: Diagnosis not present

## 2021-03-21 DIAGNOSIS — M76821 Posterior tibial tendinitis, right leg: Secondary | ICD-10-CM | POA: Diagnosis not present

## 2021-03-29 DIAGNOSIS — M62571 Muscle wasting and atrophy, not elsewhere classified, right ankle and foot: Secondary | ICD-10-CM | POA: Diagnosis not present

## 2021-03-29 DIAGNOSIS — M25671 Stiffness of right ankle, not elsewhere classified: Secondary | ICD-10-CM | POA: Diagnosis not present

## 2021-03-29 DIAGNOSIS — R2681 Unsteadiness on feet: Secondary | ICD-10-CM | POA: Diagnosis not present

## 2021-03-29 DIAGNOSIS — R269 Unspecified abnormalities of gait and mobility: Secondary | ICD-10-CM | POA: Diagnosis not present

## 2021-03-29 DIAGNOSIS — M25571 Pain in right ankle and joints of right foot: Secondary | ICD-10-CM | POA: Diagnosis not present

## 2021-04-04 DIAGNOSIS — M79671 Pain in right foot: Secondary | ICD-10-CM | POA: Diagnosis not present

## 2021-04-06 ENCOUNTER — Other Ambulatory Visit (HOSPITAL_COMMUNITY): Payer: Self-pay

## 2021-04-09 ENCOUNTER — Other Ambulatory Visit (HOSPITAL_COMMUNITY): Payer: Self-pay

## 2021-04-10 ENCOUNTER — Other Ambulatory Visit (HOSPITAL_COMMUNITY): Payer: Self-pay

## 2021-04-10 DIAGNOSIS — M79671 Pain in right foot: Secondary | ICD-10-CM | POA: Diagnosis not present

## 2021-04-17 ENCOUNTER — Other Ambulatory Visit (HOSPITAL_COMMUNITY): Payer: Self-pay

## 2021-04-18 DIAGNOSIS — M79671 Pain in right foot: Secondary | ICD-10-CM | POA: Diagnosis not present

## 2021-04-18 DIAGNOSIS — M76821 Posterior tibial tendinitis, right leg: Secondary | ICD-10-CM | POA: Diagnosis not present

## 2021-04-20 DIAGNOSIS — M79671 Pain in right foot: Secondary | ICD-10-CM | POA: Diagnosis not present

## 2021-04-23 ENCOUNTER — Telehealth: Payer: Self-pay | Admitting: Internal Medicine

## 2021-04-23 DIAGNOSIS — M79671 Pain in right foot: Secondary | ICD-10-CM | POA: Diagnosis not present

## 2021-04-23 NOTE — Telephone Encounter (Signed)
Lft pt vm to call ofc . Thank you! 

## 2021-04-25 DIAGNOSIS — M79671 Pain in right foot: Secondary | ICD-10-CM | POA: Diagnosis not present

## 2021-04-30 DIAGNOSIS — M79671 Pain in right foot: Secondary | ICD-10-CM | POA: Diagnosis not present

## 2021-05-02 DIAGNOSIS — M25571 Pain in right ankle and joints of right foot: Secondary | ICD-10-CM | POA: Diagnosis not present

## 2021-05-07 DIAGNOSIS — M79671 Pain in right foot: Secondary | ICD-10-CM | POA: Diagnosis not present

## 2021-05-14 DIAGNOSIS — M79671 Pain in right foot: Secondary | ICD-10-CM | POA: Diagnosis not present

## 2021-05-17 ENCOUNTER — Other Ambulatory Visit (HOSPITAL_COMMUNITY): Payer: Self-pay

## 2021-05-17 DIAGNOSIS — M79671 Pain in right foot: Secondary | ICD-10-CM | POA: Diagnosis not present

## 2021-05-21 DIAGNOSIS — M25571 Pain in right ankle and joints of right foot: Secondary | ICD-10-CM | POA: Diagnosis not present

## 2021-05-24 DIAGNOSIS — M25571 Pain in right ankle and joints of right foot: Secondary | ICD-10-CM | POA: Diagnosis not present

## 2021-05-29 DIAGNOSIS — M25571 Pain in right ankle and joints of right foot: Secondary | ICD-10-CM | POA: Diagnosis not present

## 2021-05-31 DIAGNOSIS — M79671 Pain in right foot: Secondary | ICD-10-CM | POA: Diagnosis not present

## 2021-06-07 DIAGNOSIS — M79671 Pain in right foot: Secondary | ICD-10-CM | POA: Diagnosis not present

## 2021-06-11 DIAGNOSIS — M79671 Pain in right foot: Secondary | ICD-10-CM | POA: Diagnosis not present

## 2021-06-18 DIAGNOSIS — M79671 Pain in right foot: Secondary | ICD-10-CM | POA: Diagnosis not present

## 2021-06-18 DIAGNOSIS — M7670 Peroneal tendinitis, unspecified leg: Secondary | ICD-10-CM | POA: Insufficient documentation

## 2021-06-18 DIAGNOSIS — M7671 Peroneal tendinitis, right leg: Secondary | ICD-10-CM | POA: Diagnosis not present

## 2021-06-22 IMAGING — MG MM DIGITAL SCREENING BILAT W/ TOMO AND CAD
6 of 10 series · 6 of 30 positions shown · non-contrast
Comparison: Previous exam(s).

CLINICAL DATA: Screening.

EXAM:
DIGITAL SCREENING BILATERAL MAMMOGRAM WITH TOMOSYNTHESIS AND CAD
TECHNIQUE: Bilateral screening digital craniocaudal and mediolateral oblique
mammograms were obtained. Bilateral screening digital breast
tomosynthesis was performed. The images were evaluated with
computer-aided detection.

[R CC synth-2D (1 of 2)]
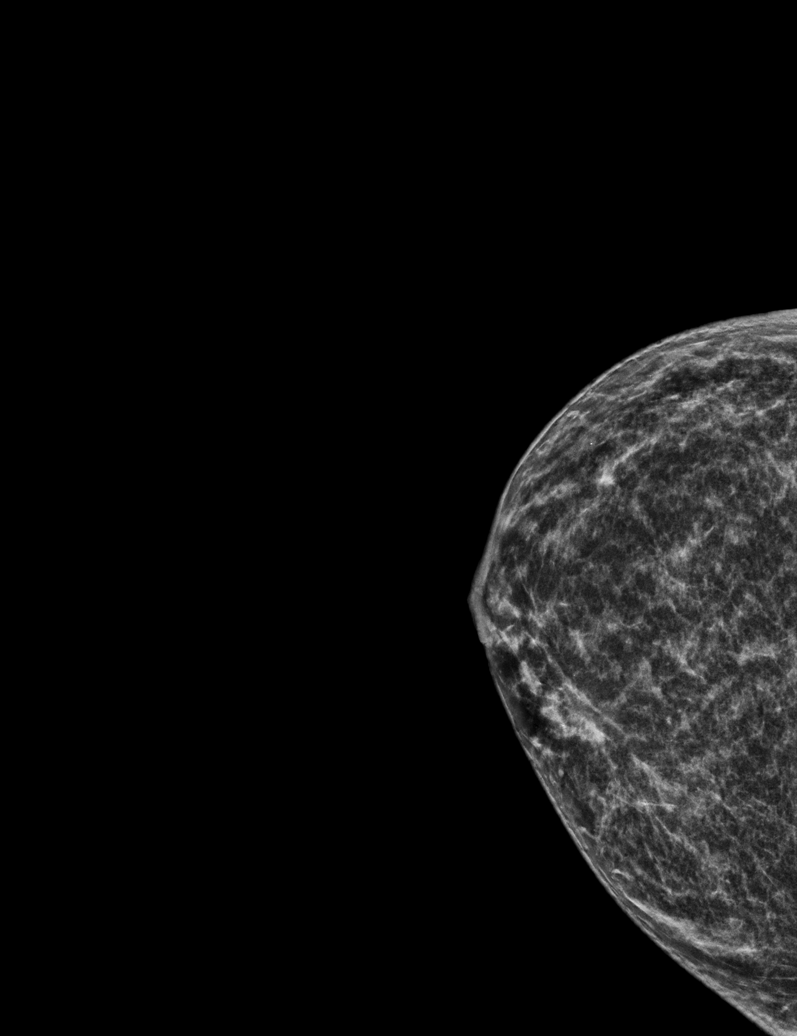

[L MLO synth-2D]
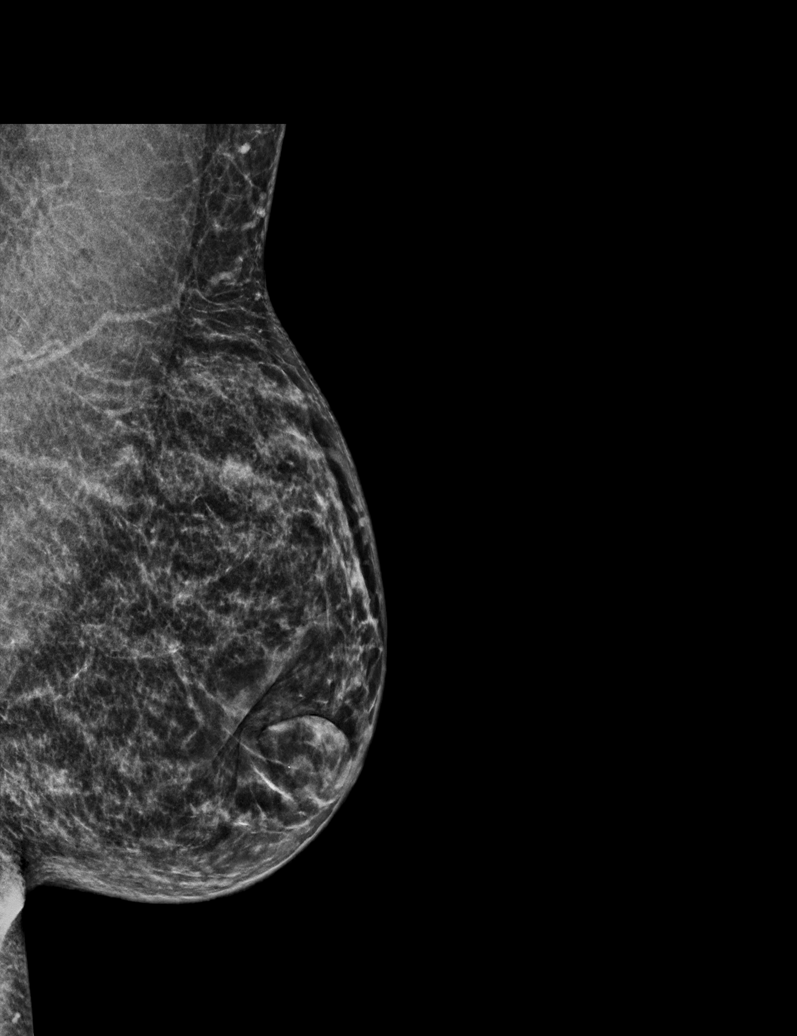

[R MLO synth-2D]
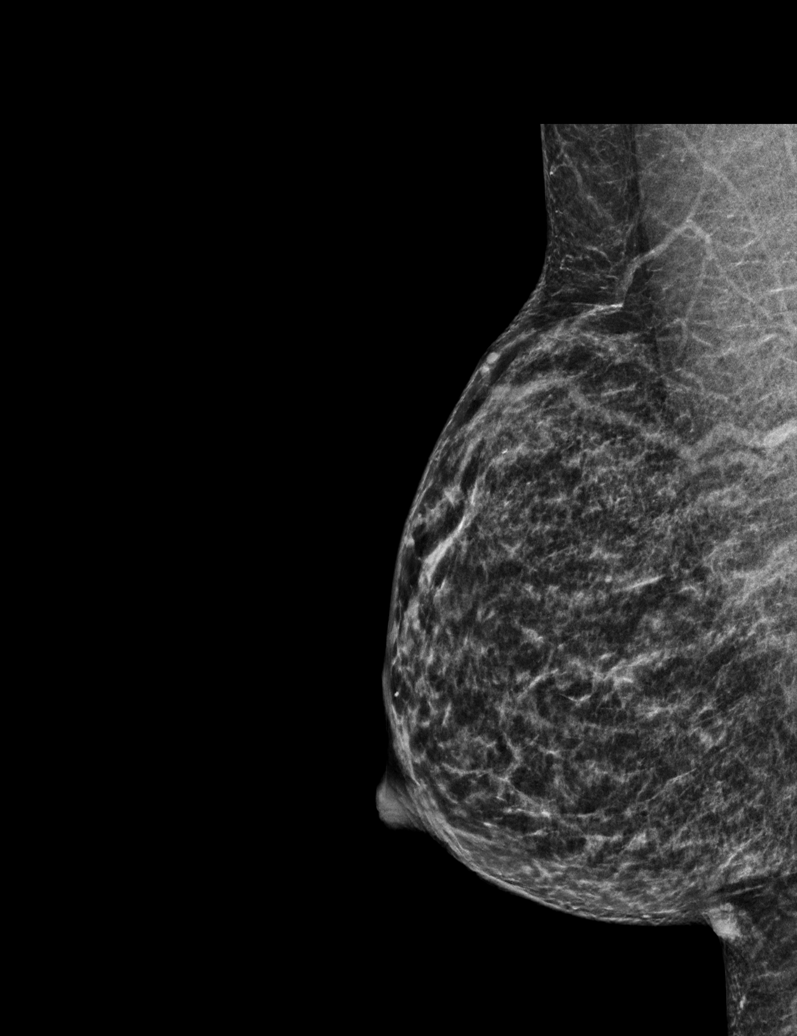

[L CC synth-2D]
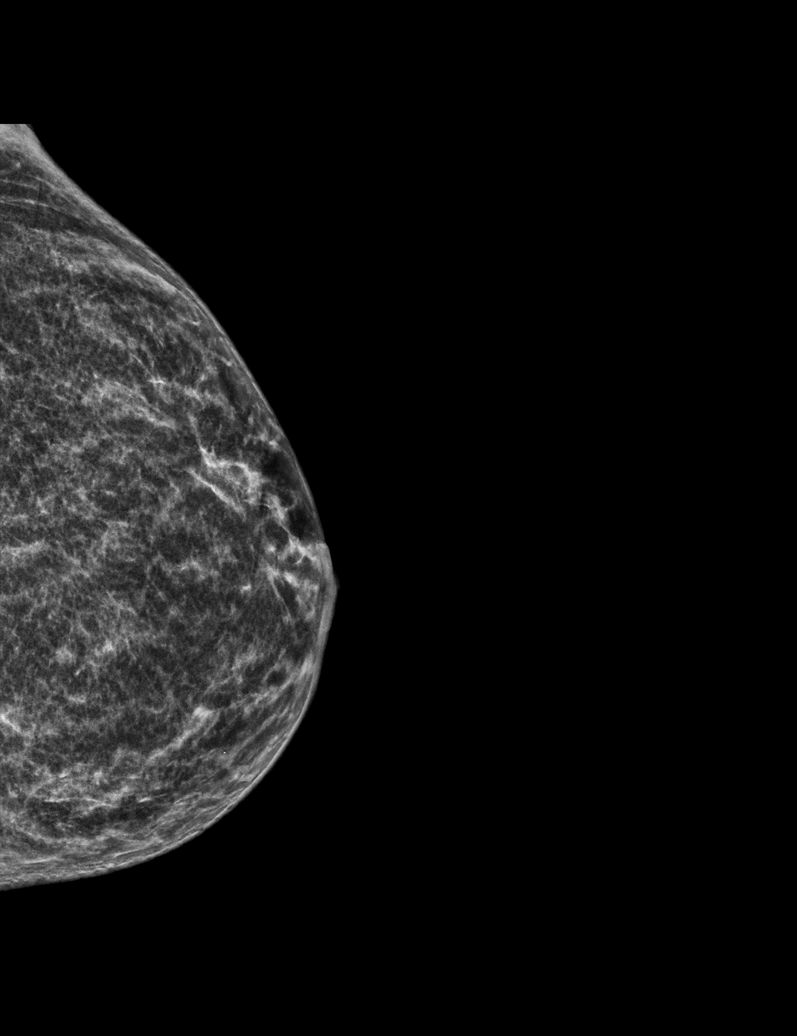

[R CC synth-2D (2 of 2)]
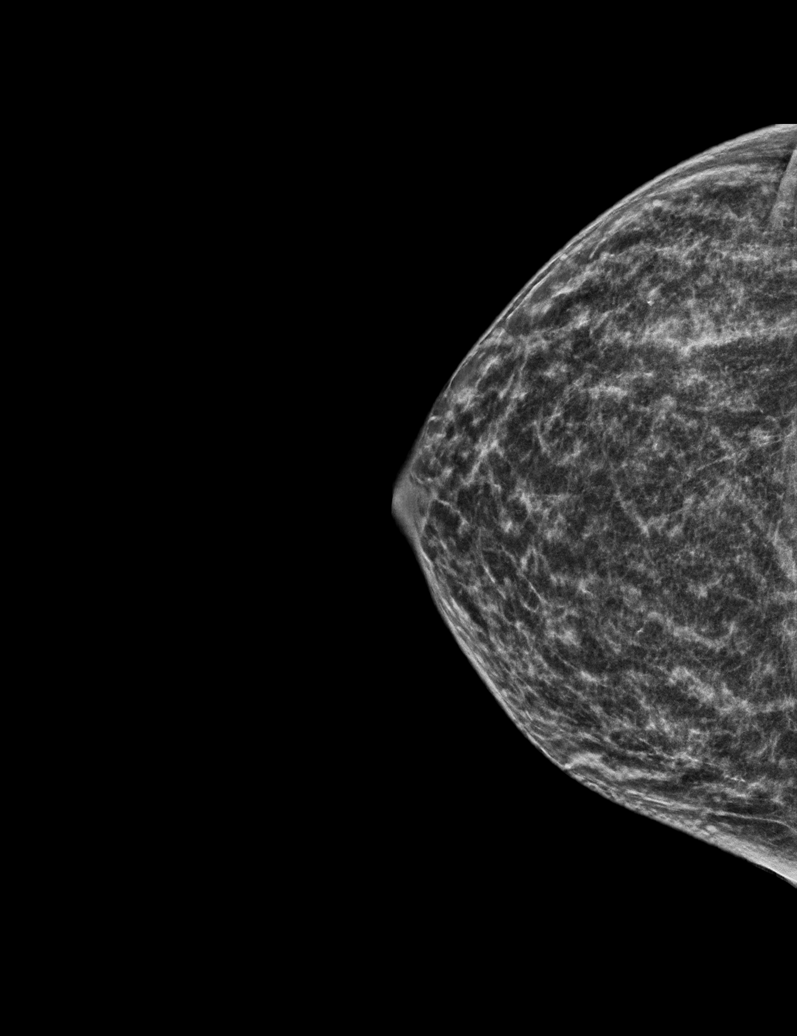

[L CC tomo · tomo slice 21/42.0]
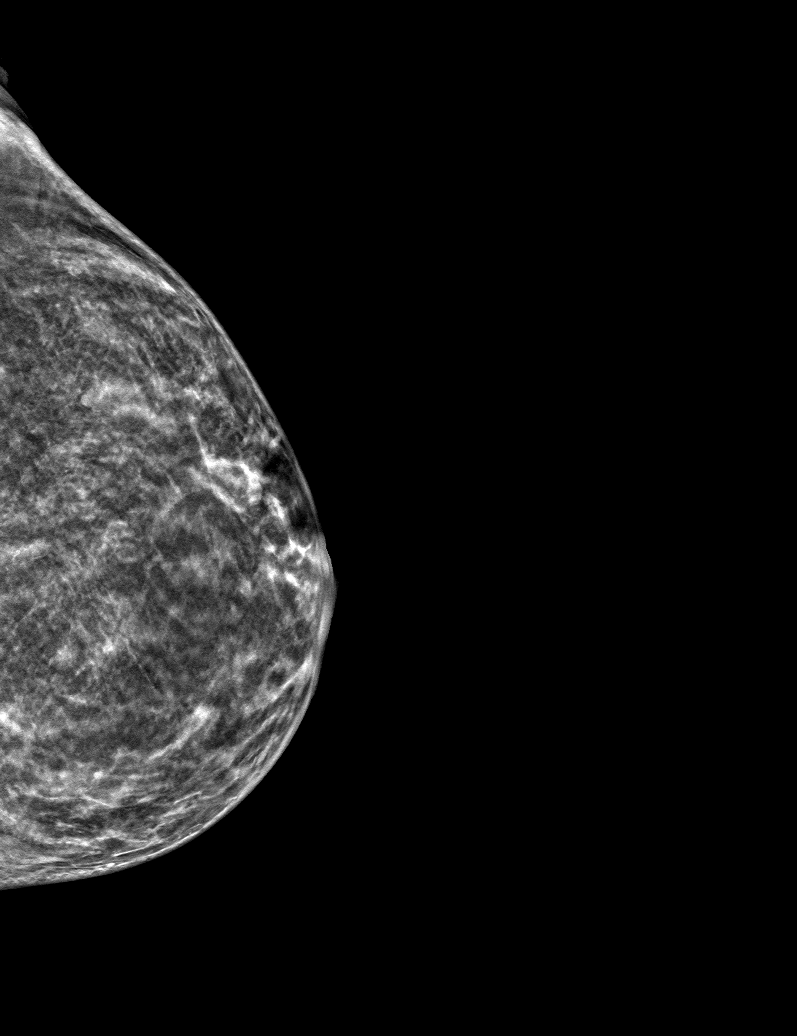

[6 of 30 positions shown; findings below may reference images not displayed]

ACR Breast Density Category c: The breast tissue is heterogeneously
dense, which may obscure small masses.
FINDINGS: There are no findings suspicious for malignancy. The images were
evaluated with computer-aided detection.
IMPRESSION: No mammographic evidence of malignancy. A result letter of this
screening mammogram will be mailed directly to the patient.

RECOMMENDATION:
Screening mammogram in one year. (Code:T4-5-GWO)

BI-RADS CATEGORY  1: Negative.

## 2021-06-27 DIAGNOSIS — M79671 Pain in right foot: Secondary | ICD-10-CM | POA: Diagnosis not present

## 2021-06-28 ENCOUNTER — Telehealth: Payer: Self-pay | Admitting: Internal Medicine

## 2021-06-29 ENCOUNTER — Other Ambulatory Visit (HOSPITAL_COMMUNITY): Payer: Self-pay

## 2021-07-04 NOTE — Telephone Encounter (Signed)
Pt need refill on Cholecalciferol sent to [facility]

## 2021-07-05 DIAGNOSIS — M79671 Pain in right foot: Secondary | ICD-10-CM | POA: Diagnosis not present

## 2021-07-06 ENCOUNTER — Ambulatory Visit
Admission: RE | Admit: 2021-07-06 | Discharge: 2021-07-06 | Disposition: A | Discharge status: Home / Self Care | Payer: Self-pay | Source: Ambulatory Visit | Attending: Internal Medicine | Admitting: Internal Medicine

## 2021-07-06 ENCOUNTER — Other Ambulatory Visit: Payer: Self-pay | Admitting: Internal Medicine

## 2021-07-06 ENCOUNTER — Other Ambulatory Visit (HOSPITAL_COMMUNITY): Payer: Self-pay

## 2021-07-06 ENCOUNTER — Encounter: Payer: Self-pay | Admitting: Internal Medicine

## 2021-07-06 DIAGNOSIS — K760 Fatty (change of) liver, not elsewhere classified: Secondary | ICD-10-CM | POA: Insufficient documentation

## 2021-07-06 DIAGNOSIS — E559 Vitamin D deficiency, unspecified: Secondary | ICD-10-CM

## 2021-07-06 DIAGNOSIS — E785 Hyperlipidemia, unspecified: Secondary | ICD-10-CM

## 2021-07-06 MED ORDER — VITAMIN D3 1.25 MG (50000 UT) PO CAPS
1.0000 | ORAL_CAPSULE | ORAL | 0 refills | Status: DC
Start: 2021-07-06 — End: 2022-04-24
  Filled 2021-07-06: qty 3, 30d supply, fill #0
  Filled 2021-08-09: qty 3, 90d supply, fill #1
  Filled 2021-11-28: qty 3, 90d supply, fill #2
  Filled 2022-04-09: qty 3, 90d supply, fill #3

## 2021-07-06 NOTE — Telephone Encounter (Signed)
Inform to change to 1x per month

## 2021-07-11 ENCOUNTER — Ambulatory Visit
Admission: RE | Admit: 2021-07-11 | Discharge: 2021-07-11 | Disposition: A | Discharge status: Home / Self Care | Payer: 59 | Source: Ambulatory Visit | Attending: Internal Medicine | Admitting: Internal Medicine

## 2021-07-11 DIAGNOSIS — M79671 Pain in right foot: Secondary | ICD-10-CM | POA: Diagnosis not present

## 2021-07-11 DIAGNOSIS — Z1231 Encounter for screening mammogram for malignant neoplasm of breast: Secondary | ICD-10-CM

## 2021-07-11 DIAGNOSIS — I77810 Thoracic aortic ectasia: Secondary | ICD-10-CM | POA: Insufficient documentation

## 2021-07-11 NOTE — Addendum Note (Signed)
Addended by: Orland Mustard on: 07/11/2021 01:32 PM   Modules accepted: Orders

## 2021-07-19 DIAGNOSIS — M79671 Pain in right foot: Secondary | ICD-10-CM | POA: Diagnosis not present

## 2021-07-19 DIAGNOSIS — M25571 Pain in right ankle and joints of right foot: Secondary | ICD-10-CM | POA: Diagnosis not present

## 2021-08-10 ENCOUNTER — Other Ambulatory Visit (HOSPITAL_COMMUNITY): Payer: Self-pay

## 2021-08-16 ENCOUNTER — Other Ambulatory Visit (HOSPITAL_COMMUNITY)
Admission: RE | Admit: 2021-08-16 | Discharge: 2021-08-16 | Disposition: A | Discharge status: Home / Self Care | Payer: 59 | Source: Ambulatory Visit | Attending: Advanced Practice Midwife | Admitting: Advanced Practice Midwife

## 2021-08-16 ENCOUNTER — Ambulatory Visit (INDEPENDENT_AMBULATORY_CARE_PROVIDER_SITE_OTHER): Payer: 59 | Admitting: Advanced Practice Midwife

## 2021-08-16 ENCOUNTER — Encounter: Payer: Self-pay | Admitting: Advanced Practice Midwife

## 2021-08-16 VITALS — BP 108/73 | HR 60 | Ht 68.0 in | Wt 244.0 lb

## 2021-08-16 DIAGNOSIS — N898 Other specified noninflammatory disorders of vagina: Secondary | ICD-10-CM | POA: Insufficient documentation

## 2021-08-16 DIAGNOSIS — Z124 Encounter for screening for malignant neoplasm of cervix: Secondary | ICD-10-CM

## 2021-08-16 DIAGNOSIS — Z01419 Encounter for gynecological examination (general) (routine) without abnormal findings: Secondary | ICD-10-CM | POA: Diagnosis not present

## 2021-08-16 DIAGNOSIS — D069 Carcinoma in situ of cervix, unspecified: Secondary | ICD-10-CM | POA: Diagnosis not present

## 2021-08-16 NOTE — Progress Notes (Signed)
GYNECOLOGY ANNUAL PREVENTATIVE CARE ENCOUNTER NOTE  History:     [name] is a 56 y.o. G1P1 female here for a routine annual gynecologic exam.  Current complaints: N/A.   Denies abnormal vaginal bleeding, discharge, pelvic pain, problems with intercourse or other gynecologic concerns.   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 She has not had vaginal bleeding since December 2022 and is hopeful that she is post-menopausal.    Gynecologic History No LMP recorded. (Menstrual status: Other). Contraception: post menopausal status Last Pap: 02/02/2019. Result was normal with negative HPV Last Mammogram: 07/2021.  Result was normal Last Colonoscopy: 09/2017.  Result was normal  Obstetric History OB History  Gravida Para Term Preterm AB Living  _0 SAB IAB Ectopic Multiple Live Births          1    # Outcome Date GA Lbr Len/2nd Weight Sex Delivery Anes PTL Lv  1 Para     F CS-Classical   LIV    Past Medical History:  Diagnosis Date   Bilateral chronic knee pain    BRCA negative    History of abnormal Pap smear    Hyperlipidemia    Vitamin D deficiency     Past Surgical History:  Procedure Laterality Date   BREAST EXCISIONAL BIOPSY Left 2000   benign   BREAST LUMPECTOMY     bx 2/2 clogged milk duct 1.5 years after nursing daughter    [place]   foot surgey     01/2021   LEEP     ?2014    Current Outpatient Medications on File Prior to Visit  Medication Sig Dispense Refill   betamethasone, augmented, (DIPROLENE) 0.05 % lotion Apply to scalp 3 times per week as needed for itching. 60 mL 3   Cholecalciferol (VITAMIN D3) 1.25 MG (50000 UT) CAPS Take 1 capsule by mouth every 30 (thirty) days. 13 capsule 0   linaclotide (LINZESS) 290 MCG CAPS capsule TAKE 1 CAPSULE BY MOUTH ONCE DAILY BEFORE BREAKFAST 90 capsule 3   pravastatin (PRAVACHOL) 20 MG tablet TAKE 1 TABLET BY MOUTH IN THE EVENING 90  tablet 3   doxycycline (VIBRA-TABS) 100 MG tablet Take 1 tablet (100 mg total) by mouth 2 (two) times daily. 20 tablet 0   nabumetone (RELAFEN) 500 MG tablet Take 1 tablet by mouth twice a day with food as needed for pain. 60 tablet 1   oxyCODONE (OXY IR/ROXICODONE) 5 MG immediate release tablet Take 1 tablet by mouth every 4 hours as needed 18 tablet 0   rivaroxaban (XARELTO) 10 MG TABS tablet Take 1 tablet by mouth once daily 14 tablet 0   No current facility-administered medications on file prior to visit.    Allergies  Allergen Reactions   Latex     sensitive   Other     Nuts tongue swelling  Berries rash    Shellfish Allergy Hives    Social History:  reports that she has never smoked. She has never used smokeless tobacco. She reports that she does not drink alcohol and does not use drugs.  Family History  Problem Relation Age of Onset   Uterine cancer Mother    Diabetes Mother    Diverticulitis Mother    Cancer Mother        uterine   Hypertension Mother    Hypertension Father    Colon  polyps Father    Hyperlipidemia Father    Colonic polyp Father    Arthritis Father    Diabetes Maternal Grandmother    Diabetes Maternal Grandfather    Breast cancer Paternal Grandmother 40       deceased at 32   Cancer Paternal Grandmother        breast   Bipolar disorder Daughter    Diabetes Maternal Uncle    Breast cancer Paternal 42        Paternal/unsure of age   75 Paternal 71        breast   Arthritis Paternal Aunt    Breast cancer Cousin 107        3 paternal cousin   Cancer Cousin        p side breast cancer    Breast cancer Cousin 59       paternal   Cancer Cousin        paternal side breast ca   Breast cancer Cousin        paternal   Uterine cancer Cousin        died age 25 02-24-2020    The following portions of the patient's history were reviewed and updated as appropriate: allergies, current medications, past family history, past medical history, past  social history, past surgical history and problem list.  Review of Systems Pertinent items noted in HPI and remainder of comprehensive ROS otherwise negative.  Physical Exam:  BP 108/73   Pulse 60   Ht _0  (1.727 m)   Wt 244 lb (110.7 kg)   BMI 37.10 kg/m  CONSTITUTIONAL: Well-developed, well-nourished female in no acute distress.  HENT:  Normocephalic, atraumatic, External right and left ear normal.  EYES: Conjunctivae and EOM are normal. Pupils are equal, round, and reactive to light. No scleral icterus.  NECK: Normal range of motion, supple, no masses.  Normal thyroid.  SKIN: Skin is warm and dry. No rash noted. Not diaphoretic. No erythema. No pallor. MUSCULOSKELETAL: Normal range of motion. No tenderness.  No cyanosis, clubbing, or edema. NEUROLOGIC: Alert and oriented to person, place, and time. Normal reflexes, muscle tone coordination.  PSYCHIATRIC: Normal mood and affect. Normal behavior. Normal judgment and thought content. CARDIOVASCULAR: Normal heart rate noted, regular rhythm RESPIRATORY: Clear to auscultation bilaterally. Effort and breath sounds normal, no problems with respiration noted. BREASTS: Symmetric in size. No masses, tenderness, skin changes, nipple drainage, or lymphadenopathy bilaterally. Performed in the presence of a chaperone. ABDOMEN: Soft, no distention noted.  No tenderness, rebound or guarding.  PELVIC: Normal appearing external genitalia and urethral meatus; normal appearing vaginal mucosa and cervix.  Thin white discharge visible on labia, scant discharge in vault.  Pap smear obtained. Performed in the presence of a chaperone.   Assessment and Plan:    1. Well woman exam with routine gynecological exam - Routine exam, no atypical findings  2. Vaginal discharge  - Cervicovaginal ancillary only( [place])  3. Screening for cervical cancer  - Cytology - PAP  4. CIN III (cervical intraepithelial neoplasia III) - 01/2013, Hx LEEP, subsequent  NILM 10/2013, 11/2014, 11/2015, 01/2019  Will follow up results of pap smear and manage accordingly. Mammogram UTD Colonoscopy UTD Routine preventative health maintenance measures emphasized. Please refer to After Visit Summary for other counseling recommendations.      Mallie Snooks, [place], MSN, CNM Certified Nurse Midwife, Product/process development scientist for [employer], [place]

## 2021-08-16 NOTE — Progress Notes (Signed)
Annual   Last Pap:02/02/19 WNL pt wants pap today  Mammogram:07/11/21 WNL  STD Screening: Declines  Family Hx of Breast Cancer: Yes   CC: None .

## 2021-08-17 LAB — CERVICOVAGINAL ANCILLARY ONLY
Bacterial Vaginitis (gardnerella): POSITIVE — AB
Candida Glabrata: NEGATIVE
Candida Vaginitis: NEGATIVE
Comment: NEGATIVE
Comment: NEGATIVE
Comment: NEGATIVE

## 2021-08-17 MED ORDER — METRONIDAZOLE 500 MG PO TABS
500.0000 mg | ORAL_TABLET | Freq: Two times a day (BID) | ORAL | 0 refills | Status: DC
Start: 2021-08-17 — End: 2021-11-06
  Filled 2021-08-17: qty 14, 7d supply, fill #0

## 2021-08-17 NOTE — Addendum Note (Signed)
Addended by: Darlina Rumpf on: 08/17/2021 10:43 PM   Modules accepted: Orders

## 2021-08-18 ENCOUNTER — Other Ambulatory Visit (HOSPITAL_COMMUNITY): Payer: Self-pay

## 2021-08-20 ENCOUNTER — Encounter: Payer: Self-pay | Admitting: Advanced Practice Midwife

## 2021-08-20 DIAGNOSIS — M7671 Peroneal tendinitis, right leg: Secondary | ICD-10-CM | POA: Diagnosis not present

## 2021-08-20 DIAGNOSIS — M79671 Pain in right foot: Secondary | ICD-10-CM | POA: Diagnosis not present

## 2021-08-20 LAB — CYTOLOGY - PAP
Comment: NEGATIVE
Diagnosis: NEGATIVE
High risk HPV: NEGATIVE

## 2021-08-22 DIAGNOSIS — H5213 Myopia, bilateral: Secondary | ICD-10-CM | POA: Diagnosis not present

## 2021-09-03 ENCOUNTER — Other Ambulatory Visit (HOSPITAL_COMMUNITY): Payer: Self-pay

## 2021-09-05 ENCOUNTER — Other Ambulatory Visit (HOSPITAL_COMMUNITY): Payer: Self-pay

## 2021-09-21 ENCOUNTER — Encounter: Payer: Self-pay | Admitting: Internal Medicine

## 2021-09-21 ENCOUNTER — Ambulatory Visit: Payer: 59 | Attending: Internal Medicine | Admitting: Internal Medicine

## 2021-09-21 VITALS — BP 126/77 | HR 54 | Ht 68.0 in | Wt 248.2 lb

## 2021-09-21 DIAGNOSIS — E785 Hyperlipidemia, unspecified: Secondary | ICD-10-CM

## 2021-09-21 DIAGNOSIS — I77810 Thoracic aortic ectasia: Secondary | ICD-10-CM

## 2021-09-21 DIAGNOSIS — K760 Fatty (change of) liver, not elsewhere classified: Secondary | ICD-10-CM | POA: Diagnosis not present

## 2021-09-21 NOTE — Patient Instructions (Signed)
Medication Instructions:   Dr. Debara Pickett has recommended a two week statin-holiday. This means do not take pravastatin for 2 weeks. Please call our office or send a MyChart message with and update on how you are feeling off the medication  *If you need a refill on your cardiac medications before your next appointment, please call your pharmacy*   Lab Work: FASTING NMR lipoprofile, LPa in 4-6 months -- complete before next appointment   If you have labs (blood work) drawn today and your tests are completely normal, you will receive your results only by: [place] (if you have MyChart) OR A paper copy in the mail If you have any lab test that is abnormal or we need to change your treatment, we will call you to review the results.   Testing/Procedures: CT angiogram chest/aorta in 1 year   Follow-Up: At [facility], you and your health needs are our priority.  As part of our continuing mission to provide you with exceptional heart care, we have created designated Provider Care Teams.  These Care Teams include your primary Cardiologist (physician) and Advanced Practice Providers (APPs -  Physician Assistants and Nurse Practitioners) who all work together to provide you with the care you need, when you need it.  We recommend signing up for the patient portal called "MyChart".  Sign up information is provided on this After Visit Summary.  MyChart is used to connect with patients for Virtual Visits (Telemedicine).  Patients are able to view lab/test results, encounter notes, upcoming appointments, etc.  Non-urgent messages can be sent to your provider as well.   To learn more about what you can do with MyChart, go to [email or web address].    Your next appointment:    4-6 months with Dr. Debara Pickett

## 2021-09-21 NOTE — Progress Notes (Signed)
LIPID CLINIC CONSULT NOTE  Chief Complaint:  Dyslipidemia, abnormal CT  Primary Care Physician: [name], Patricia Glow, MD  Primary Cardiologist:  None  HPI:  [name] is a 56 y.o. female who is being seen today for the evaluation of dyslipidemia at the request of [name], [name] *.  This is a pleasant 56 year old female kindly referred for evaluation management of dyslipidemia and abnormal CT.  She has a history of high cholesterol which has been in the family and is noted that her cholesterol has not necessarily gone down with changes in diet or recent weight loss.  She underwent a calcium score on July 06, 2021 which showed 0 coronary calcium however her aorta was borderline enlarged at 39 mm.  There was a small subpleural nodule which was considered benign as well as hepatic steatosis.  She has been on 20 mg of pravastatin but no other statins in the past and seems to tolerate this.  Her untreated LDL cholesterol is likely about 20 to 30% higher.  She reports a varied diet and works from home.  She has recently lost weight as she mention.  She tries to avoid saturated fats.  She gets some exercise.  PMHx:  Past Medical History:  Diagnosis Date   Bilateral chronic knee pain    BRCA negative    History of abnormal Pap smear    Hyperlipidemia    Vitamin D deficiency     Past Surgical History:  Procedure Laterality Date   BREAST EXCISIONAL BIOPSY Left March 14, 1998   benign   BREAST LUMPECTOMY     bx 2/2 clogged milk duct 1.5 years after nursing daughter    [place]   foot surgey     01/2021   LEEP     ?Mar 14, 2012    FAMHx:  Family History  Problem Relation Age of Onset   Uterine cancer Mother    Diabetes Mother    Diverticulitis Mother    Cancer Mother        uterine   Hypertension Mother    Hypertension Father    Colon polyps Father    Hyperlipidemia Father    Colonic polyp Father    Arthritis Father    Diabetes Maternal Grandmother     Diabetes Maternal Grandfather    Breast cancer Paternal Grandmother 70       deceased at 53   Cancer Paternal Grandmother        breast   Bipolar disorder Daughter    Diabetes Maternal Uncle    Breast cancer Paternal 74        Paternal/unsure of age   Cancer Paternal 41        breast   Arthritis Paternal Aunt    Breast cancer Cousin 8        3 paternal cousin   Cancer Cousin        p side breast cancer    Breast cancer Cousin 5       paternal   Cancer Cousin        paternal side breast ca   Breast cancer Cousin        paternal   Uterine cancer Cousin        died age 43 14-Mar-2020    SOCHx:   reports that she has never smoked. She has never used smokeless tobacco. She reports that she does not drink alcohol and does not use drugs.  ALLERGIES:  Allergies  Allergen Reactions   Latex  sensitive   Other     Nuts tongue swelling  Berries rash    Shellfish Allergy Hives    ROS: Pertinent items noted in HPI and remainder of comprehensive ROS otherwise negative.  HOME MEDS: Current Outpatient Medications on File Prior to Visit  Medication Sig Dispense Refill   betamethasone, augmented, (DIPROLENE) 0.05 % lotion Apply to scalp 3 times per week as needed for itching. 60 mL 3   Cholecalciferol (VITAMIN D3) 1.25 MG (50000 UT) CAPS Take 1 capsule by mouth every 30 (thirty) days. 13 capsule 0   linaclotide (LINZESS) 290 MCG CAPS capsule TAKE 1 CAPSULE BY MOUTH ONCE DAILY BEFORE BREAKFAST 90 capsule 3   metroNIDAZOLE (FLAGYL) 500 MG tablet Take 1 tablet (500 mg total) by mouth 2 (two) times daily. 14 tablet 0   pravastatin (PRAVACHOL) 20 MG tablet TAKE 1 TABLET BY MOUTH IN THE EVENING 90 tablet 3   No current facility-administered medications on file prior to visit.    LABS/IMAGING: No results found for this or any previous visit (from the past 48 hour(s)). No results found.  LIPID PANEL:    Component Value Date/Time   CHOL 222 (H) 01/05/2021 0952   CHOL 235 (H)  06/29/2020 0916   TRIG 145.0 01/05/2021 0952   HDL 47.80 01/05/2021 0952   HDL 58 06/29/2020 0916   CHOLHDL 5 01/05/2021 0952   VLDL 29.0 01/05/2021 0952   LDLCALC 145 (H) 01/05/2021 0952   LDLCALC 151 (H) 06/29/2020 0916    WEIGHTS: Wt Readings from Last 3 Encounters:  09/21/21 248 lb 3.2 oz (112.6 kg)  08/16/21 244 lb (110.7 kg)  01/05/21 240 lb 12.8 oz (109.2 kg)    VITALS: BP 126/77   Pulse (!) 54   Ht 5' 8"  (1.727 m)   Wt 248 lb 3.2 oz (112.6 kg)   SpO2 98%   BMI 37.74 kg/m   EXAM: Deferred  EKG: Deferred  ASSESSMENT: Mixed dyslipidemia, goal LDL less than 100 0 coronary calcium (07/06/2021) 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   [name] has a mixed dyslipidemia but fortunately had no coronary calcium.  She does have a family history of high cholesterol and possibly genetic dyslipidemia.  It would be reasonable to target her LDL to less than 100.  She already is on pravastatin.  She thinks she may be having some side effects on the pravastatin that she had not considered before.  We will plan a 2-week statin holiday to see if her symptoms improve.  If so then I would consider switching her to rosuvastatin at a low dose which may give her more lipid reduction at lower risk.  Otherwise, if tolerated she should restart the medicine and I might increase her pravastatin for more benefit.  We will discuss this once I have heard from her.  I will also order follow-up of her dilated aorta with a CT angiogram of the aorta in 1 year and this will allow [place] to look at her subpleural nodule which is likely statistically benign.  Thanks again for the kind referral.  Patricia Casino, MD, FACC, [place] Director of the Advanced Lipid Disorders &  Cardiovascular Risk Reduction Clinic Diplomate of the American Board of Clinical Lipidology Attending Cardiologist  Direct Dial: [phone]   Fax: [phone]  Website:  [name] 09/21/2021, 3:07 PM

## 2021-10-05 ENCOUNTER — Encounter: Payer: Self-pay | Admitting: Advanced Practice Midwife

## 2021-10-09 ENCOUNTER — Encounter: Payer: Self-pay | Admitting: Internal Medicine

## 2021-10-09 DIAGNOSIS — E785 Hyperlipidemia, unspecified: Secondary | ICD-10-CM

## 2021-10-16 ENCOUNTER — Other Ambulatory Visit (HOSPITAL_COMMUNITY): Payer: Self-pay

## 2021-10-16 MED ORDER — ROSUVASTATIN CALCIUM 10 MG PO TABS
10.0000 mg | ORAL_TABLET | Freq: Every day | ORAL | 3 refills | Status: DC
  Filled 2021-10-16: qty 90, 90d supply, fill #0
  Filled 2022-01-09 – 2022-01-14 (×2): qty 90, 90d supply, fill #1

## 2021-11-02 ENCOUNTER — Other Ambulatory Visit (HOSPITAL_COMMUNITY)
Admission: RE | Admit: 2021-11-02 | Discharge: 2021-11-02 | Disposition: A | Discharge status: Home / Self Care | Payer: 59 | Source: Ambulatory Visit | Attending: Family Medicine | Admitting: Family Medicine

## 2021-11-02 ENCOUNTER — Ambulatory Visit (INDEPENDENT_AMBULATORY_CARE_PROVIDER_SITE_OTHER): Payer: 59 | Admitting: *Deleted

## 2021-11-02 VITALS — BP 113/78 | HR 70

## 2021-11-02 DIAGNOSIS — N898 Other specified noninflammatory disorders of vagina: Secondary | ICD-10-CM | POA: Diagnosis not present

## 2021-11-02 NOTE — Progress Notes (Signed)
SUBJECTIVE:  56 y.o. female complains of BV not resolving and would like to be checked again. Denies abnormal vaginal bleeding or significant pelvic pain or fever. No UTI symptoms. Denies history of known exposure to STD.  No LMP recorded. (Menstrual status: Other).  OBJECTIVE:  She appears well, afebrile. Urine dipstick: not done.  ASSESSMENT:  Vaginal Discharge  Vaginal Odor   PLAN:  BVAG, CVAG probe sent to lab. Treatment: To be determined once lab results are received ROV prn if symptoms persist or worsen.   Crosby Oyster, RN

## 2021-11-05 LAB — CERVICOVAGINAL ANCILLARY ONLY
Bacterial Vaginitis (gardnerella): POSITIVE — AB
Candida Glabrata: NEGATIVE
Candida Vaginitis: NEGATIVE
Comment: NEGATIVE
Comment: NEGATIVE
Comment: NEGATIVE

## 2021-11-06 ENCOUNTER — Other Ambulatory Visit (HOSPITAL_COMMUNITY): Payer: Self-pay

## 2021-11-06 ENCOUNTER — Other Ambulatory Visit: Payer: Self-pay | Admitting: Obstetrics & Gynecology

## 2021-11-06 DIAGNOSIS — N760 Acute vaginitis: Secondary | ICD-10-CM

## 2021-11-06 MED ORDER — METRONIDAZOLE 500 MG PO TABS
500.0000 mg | ORAL_TABLET | Freq: Two times a day (BID) | ORAL | 0 refills | Status: AC
  Filled 2021-11-06: qty 14, 7d supply, fill #0

## 2021-11-20 ENCOUNTER — Encounter: Payer: Self-pay | Admitting: Advanced Practice Midwife

## 2021-11-20 DIAGNOSIS — N760 Acute vaginitis: Secondary | ICD-10-CM

## 2021-11-21 ENCOUNTER — Other Ambulatory Visit (HOSPITAL_COMMUNITY): Payer: Self-pay

## 2021-11-21 MED ORDER — CLINDAMYCIN HCL 300 MG PO CAPS
300.0000 mg | ORAL_CAPSULE | Freq: Two times a day (BID) | ORAL | 0 refills | Status: AC
  Filled 2021-11-21: qty 14, 7d supply, fill #0

## 2021-11-28 ENCOUNTER — Other Ambulatory Visit (HOSPITAL_COMMUNITY): Payer: Self-pay

## 2021-11-30 ENCOUNTER — Encounter (HOSPITAL_COMMUNITY): Payer: Self-pay | Admitting: Pharmacist

## 2021-11-30 ENCOUNTER — Other Ambulatory Visit (HOSPITAL_COMMUNITY): Payer: Self-pay

## 2021-12-04 ENCOUNTER — Other Ambulatory Visit (HOSPITAL_COMMUNITY): Payer: Self-pay

## 2021-12-10 ENCOUNTER — Telehealth: Payer: Self-pay

## 2021-12-10 ENCOUNTER — Other Ambulatory Visit (HOSPITAL_COMMUNITY): Payer: Self-pay

## 2021-12-10 ENCOUNTER — Other Ambulatory Visit: Payer: Self-pay | Admitting: Internal Medicine

## 2021-12-10 DIAGNOSIS — T782XXD Anaphylactic shock, unspecified, subsequent encounter: Secondary | ICD-10-CM

## 2021-12-10 MED ORDER — EPINEPHRINE 0.3 MG/0.3ML IJ SOAJ
INTRAMUSCULAR | 2 refills | Status: AC
  Filled 2021-12-10: qty 2, 30d supply, fill #0
  Filled 2022-04-09: qty 2, 30d supply, fill #1
  Filled 2022-07-22: qty 2, 30d supply, fill #2

## 2021-12-10 NOTE — Telephone Encounter (Signed)
Called pt due to her Epipen refill request.  Pt did not have any up coming appts with a new PCP so I wanted to get pt scheduled. Pt was agreeable to being booked with Dr. Volanda Napoleon and pt has been scheduled 04/23/22.  Refill for Epipen has been sent.

## 2022-01-08 ENCOUNTER — Encounter: Payer: 59 | Admitting: Internal Medicine

## 2022-01-09 ENCOUNTER — Other Ambulatory Visit (HOSPITAL_COMMUNITY): Payer: Self-pay

## 2022-03-05 LAB — NMR, LIPOPROFILE
Cholesterol, Total: 156 mg/dL (ref 100–199)
HDL Particle Number: 25.3 umol/L — ABNORMAL LOW (ref >=30.5)
HDL-C: 45 mg/dL (ref >39)
LDL Particle Number: 1142 nmol/L — ABNORMAL HIGH (ref <1000)
LDL Size: 21.1 nm (ref >20.5)
LDL-C (NIH Calc): 83 mg/dL (ref 0–99)
LP-IR Score: 54 — ABNORMAL HIGH (ref <=45)
Small LDL Particle Number: 614 nmol/L — ABNORMAL HIGH (ref <=527)
Triglycerides: 161 mg/dL — ABNORMAL HIGH (ref 0–149)

## 2022-03-05 LAB — LIPOPROTEIN A (LPA): Lipoprotein (a): 90.2 nmol/L — ABNORMAL HIGH (ref <75.0)

## 2022-03-06 ENCOUNTER — Encounter: Payer: Self-pay | Admitting: Internal Medicine

## 2022-03-06 ENCOUNTER — Other Ambulatory Visit: Payer: Self-pay

## 2022-03-06 ENCOUNTER — Ambulatory Visit: Payer: Commercial Managed Care - PPO | Attending: Internal Medicine | Admitting: Internal Medicine

## 2022-03-06 VITALS — BP 124/80 | Ht 68.0 in | Wt 255.0 lb

## 2022-03-06 DIAGNOSIS — I77810 Thoracic aortic ectasia: Secondary | ICD-10-CM

## 2022-03-06 DIAGNOSIS — E785 Hyperlipidemia, unspecified: Secondary | ICD-10-CM

## 2022-03-06 MED ORDER — ROSUVASTATIN CALCIUM 10 MG PO TABS
10.0000 mg | ORAL_TABLET | Freq: Every day | ORAL | 3 refills | Status: DC
  Filled 2022-03-06 – 2022-04-09 (×2): qty 90, 90d supply, fill #0
  Filled 2022-07-22: qty 90, 90d supply, fill #1
  Filled 2022-10-21: qty 90, 90d supply, fill #2
  Filled 2023-01-15: qty 90, 90d supply, fill #3

## 2022-03-06 NOTE — Progress Notes (Signed)
LIPID CLINIC CONSULT NOTE  Chief Complaint:  Dyslipidemia  Primary Care Physician: No primary care provider on file.  Primary Cardiologist:  None  HPI:  [name] is a 57 y.o. female who is being seen today for the evaluation of dyslipidemia at the request of McLean-Scocuzza, Olivia Mackie *.  This is a pleasant 57 year old female kindly referred for evaluation management of dyslipidemia and abnormal CT.  She has a history of high cholesterol which has been in the family and is noted that her cholesterol has not necessarily gone down with changes in diet or recent weight loss.  She underwent a calcium score on July 06, 2021 which showed 0 coronary calcium however her aorta was borderline enlarged at 39 mm.  There was a small subpleural nodule which was considered benign as well as hepatic steatosis.  She has been on 20 mg of pravastatin but no other statins in the past and seems to tolerate this.  Her untreated LDL cholesterol is likely about 20 to 30% higher.  She reports a varied diet and works from home.  She has recently lost weight as she mention.  She tries to avoid saturated fats.  She gets some exercise.  03/06/2022  [name] returns today for follow-up.  She seems to be tolerating lower dose rosuvastatin 10 mg daily well without side effects.  This is significantly improved her lipids.  Her LDL particle number is now 1142, LDL-C of 83, HDL 45 and triglycerides 161.  She did have mildly elevated LP(a) at 90.2 nmol/L however would not qualify for additional treatments such as a PCSK9 inhibitor for this.  PMHx:  Past Medical History:  Diagnosis Date   Bilateral chronic knee pain    BRCA negative    History of abnormal Pap smear    Hyperlipidemia    Vitamin D deficiency     Past Surgical History:  Procedure Laterality Date   BREAST EXCISIONAL BIOPSY Left 2000   benign   BREAST LUMPECTOMY     bx 2/2 clogged milk duct 1.5 years after nursing daughter    [place]   foot surgey     01/2021   LEEP     ?2014    FAMHx:  Family History  Problem Relation Age of Onset   Uterine cancer Mother    Diabetes Mother    Diverticulitis Mother    Cancer Mother        uterine   Hypertension Mother    Hypertension Father    Colon polyps Father    Hyperlipidemia Father    Colonic polyp Father    Arthritis Father    Diabetes Maternal Grandmother    Diabetes Maternal Grandfather    Breast cancer Paternal Grandmother 40       deceased at 15   Cancer Paternal Grandmother        breast   Bipolar disorder Daughter    Diabetes Maternal Uncle    Breast cancer Paternal 62        Paternal/unsure of age   Cancer Paternal Aunt        breast   Arthritis Paternal Aunt    Breast cancer Cousin 55        3 paternal cousin   Cancer Cousin        p side breast cancer    Breast cancer Cousin 53       paternal   Cancer Cousin        paternal side  breast ca   Breast cancer Cousin        paternal   Uterine cancer Cousin        died age 24 02/2020    SOCHx:   reports that she has never smoked. She has never used smokeless tobacco. She reports that she does not drink alcohol and does not use drugs.  ALLERGIES:  Allergies  Allergen Reactions   Latex     sensitive   Other     Nuts tongue swelling  Berries rash    Shellfish Allergy Hives    ROS: Pertinent items noted in HPI and remainder of comprehensive ROS otherwise negative.  HOME MEDS: Current Outpatient Medications on File Prior to Visit  Medication Sig Dispense Refill   betamethasone, augmented, (DIPROLENE) 0.05 % lotion Apply to scalp 3 times per week as needed for itching. 60 mL 3   Cholecalciferol (VITAMIN D3) 1.25 MG (50000 UT) CAPS Take 1 capsule by mouth every 30 (thirty) days. 13 capsule 0   EPINEPHrine 0.3 mg/0.3 mL IJ SOAJ injection INJECT 1 PEN INTO THE MUSCLE AS NEEDED FOR ANAPHYLAXIS 2 each 2   hydrocortisone 2.5 % cream Apply 1 Application topically 2 (two) times  daily. PRN     linaclotide (LINZESS) 290 MCG CAPS capsule TAKE 1 CAPSULE BY MOUTH ONCE DAILY BEFORE BREAKFAST 90 capsule 3   rosuvastatin (CRESTOR) 10 MG tablet Take 1 tablet (10 mg total) by mouth daily. 90 tablet 3   No current facility-administered medications on file prior to visit.    LABS/IMAGING: No results found for this or any previous visit (from the past 48 hour(s)). No results found.  LIPID PANEL:    Component Value Date/Time   CHOL 222 (H) 01/05/2021 0952   CHOL 235 (H) 06/29/2020 0916   TRIG 145.0 01/05/2021 0952   HDL 47.80 01/05/2021 0952   HDL 58 06/29/2020 0916   CHOLHDL 5 01/05/2021 0952   VLDL 29.0 01/05/2021 0952   LDLCALC 145 (H) 01/05/2021 0952   LDLCALC 151 (H) 06/29/2020 0916    WEIGHTS: Wt Readings from Last 3 Encounters:  03/06/22 255 lb (115.7 kg)  09/21/21 248 lb 3.2 oz (112.6 kg)  08/16/21 244 lb (110.7 kg)    VITALS: BP 124/80 (BP Location: Left Arm, Patient Position: Sitting, Cuff Size: Large)   Ht '5\' 8"'$  (1.727 m)   Wt 255 lb (115.7 kg)   SpO2 98%   BMI 38.77 kg/m   EXAM: Deferred  EKG: Deferred  ASSESSMENT: Mixed dyslipidemia, goal LDL less than 100 0 coronary calcium (07/06/2021) 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   [name] has had a significant reduction in her lipids on rosuvastatin which she seems to be tolerating better.  She did have a dilated aorta and should have a follow-up CT scan which has been ordered to be repeated in this summer for follow-up of a dilated aorta which measured 39 mm.  Otherwise I would continue her current dose of statin and plan follow-up in 1 year with repeat lipids.  Pixie Casino, MD, [facility], [place] Director of the Advanced Lipid Disorders &  Cardiovascular Risk Reduction Clinic Diplomate of the American Board of Clinical Lipidology Attending Cardiologist  Direct Dial: [phone]  Fax:  [phone]  Website:  [email or web address] 03/06/2022, 2:20 PM

## 2022-03-06 NOTE — Patient Instructions (Signed)
Medication Instructions:  NO CHANGES  *If you need a refill on your cardiac medications before your next appointment, please call your pharmacy*   Follow-Up: At [facility], you and your health needs are our priority.  As part of our continuing mission to provide you with exceptional heart care, we have created designated Provider Care Teams.  These Care Teams include your primary Cardiologist (physician) and Advanced Practice Providers (APPs -  Physician Assistants and Nurse Practitioners) who all work together to provide you with the care you need, when you need it.  We recommend signing up for the patient portal called "MyChart".  Sign up information is provided on this After Visit Summary.  MyChart is used to connect with patients for Virtual Visits (Telemedicine).  Patients are able to view lab/test results, encounter notes, upcoming appointments, etc.  Non-urgent messages can be sent to your provider as well.   To learn more about what you can do with MyChart, go to [email or web address].    Your next appointment:    12 months with Dr. Hilty  

## 2022-03-07 ENCOUNTER — Other Ambulatory Visit (HOSPITAL_COMMUNITY): Payer: Self-pay

## 2022-04-09 ENCOUNTER — Other Ambulatory Visit: Payer: Self-pay

## 2022-04-23 ENCOUNTER — Ambulatory Visit: Payer: Commercial Managed Care - PPO | Admitting: Family Medicine

## 2022-04-23 VITALS — BP 110/66 | HR 47 | Temp 97.9°F | Ht 68.0 in | Wt 242.6 lb

## 2022-04-23 DIAGNOSIS — E669 Obesity, unspecified: Secondary | ICD-10-CM

## 2022-04-23 DIAGNOSIS — E559 Vitamin D deficiency, unspecified: Secondary | ICD-10-CM

## 2022-04-23 DIAGNOSIS — E785 Hyperlipidemia, unspecified: Secondary | ICD-10-CM | POA: Diagnosis not present

## 2022-04-23 DIAGNOSIS — R7303 Prediabetes: Secondary | ICD-10-CM

## 2022-04-23 DIAGNOSIS — Z1159 Encounter for screening for other viral diseases: Secondary | ICD-10-CM | POA: Diagnosis not present

## 2022-04-23 DIAGNOSIS — K5909 Other constipation: Secondary | ICD-10-CM

## 2022-04-23 DIAGNOSIS — Z114 Encounter for screening for human immunodeficiency virus [HIV]: Secondary | ICD-10-CM

## 2022-04-23 DIAGNOSIS — Z1231 Encounter for screening mammogram for malignant neoplasm of breast: Secondary | ICD-10-CM

## 2022-04-23 DIAGNOSIS — I77810 Thoracic aortic ectasia: Secondary | ICD-10-CM

## 2022-04-23 DIAGNOSIS — R001 Bradycardia, unspecified: Secondary | ICD-10-CM

## 2022-04-23 NOTE — Progress Notes (Signed)
SUBJECTIVE:   Chief Complaint  Patient presents with   Transitions Of Care   HPI Patient presents to clinic to transfer care.  No acute concerns today.  Hyperlipidemia Prescribed Crestor 10 mg daily by cardiology.  Recent lipoprotein a elevated.  Previous calcium score 0.   Chronic constipation. Was previously probably scribed Linzess but self discontinued.     PERTINENT PMH / PSH: Hyperlipidemia Lipoprotein a elevated Chronic constipation Bradycardia Eczema Multiple allergies has EpiPen  OBJECTIVE:  BP 110/66   Pulse (!) 47   Temp 97.9 F (36.6 C) (Oral)   Ht  (1.727 m)   Wt 242 lb 9.6 oz (110 kg)   SpO2 98%   BMI 36.89 kg/m    Physical Exam Vitals reviewed.  Constitutional:      General: She is not in acute distress.    Appearance: She is obese. She is not ill-appearing.  HENT:     Head: Normocephalic.     Nose: Nose normal.  Eyes:     Conjunctiva/sclera: Conjunctivae normal.  Neck:     Thyroid: No thyromegaly or thyroid tenderness.  Cardiovascular:     Rate and Rhythm: Regular rhythm. Bradycardia present.     Heart sounds: Normal heart sounds.  Pulmonary:     Effort: Pulmonary effort is normal.     Breath sounds: Normal breath sounds.  Abdominal:     General: Abdomen is flat. Bowel sounds are normal.     Palpations: Abdomen is soft.  Musculoskeletal:        General: Normal range of motion.     Cervical back: Normal range of motion.  Neurological:     Mental Status: She is alert and oriented to person, place, and time. Mental status is at baseline.  Psychiatric:        Mood and Affect: Mood normal.        Behavior: Behavior normal.        Thought Content: Thought content normal.        Judgment: Judgment normal.     ASSESSMENT/PLAN:  Breast cancer screening by mammogram -     3D Screening Mammogram, Left and Right; Future  Vitamin D deficiency -     VITAMIN D 25 Hydroxy (Vit-D Deficiency, Fractures)  Obesity (BMI 35.0-39.9  without comorbidity) Assessment & Plan: BMI elevated Check labs Encouraged healthy diet and increase exercise  Orders: -     Hemoglobin A1c -     CBC with Differential/Platelet -     Comprehensive metabolic panel -     Magnesium -     TSH -     Vitamin B12 -     3D Screening Mammogram, Left and Right; Future  Encounter for screening for HIV -     HIV Antibody (routine testing w rflx)  Encounter for hepatitis C screening test for low risk patient -     Hepatitis C antibody  Chronic constipation Assessment & Plan: Chronic.  Colonoscopy up-to-date.  Recommend follow-up in 10 years. Increase soluble fiber in diet Increase water intake Recommend MiraLAX 1 scoop as needed Recommend Metamucil daily    Prediabetes Assessment & Plan: Check A1c   Hyperlipidemia, unspecified hyperlipidemia type Assessment & Plan: Currently on statin therapy and tolerating well.  Elevated lipoprotein a Continue Crestor 10 mg daily   Bradycardia Assessment & Plan: Asymptomatic. Not currently on AV nodal blockers Sees cardiology Checking labs today   Ascending aorta dilatation Assessment & Plan: Noted on cardiac calcium score. Blood pressure  well-controlled. Follows with cardiology.   HCM Mammogram up-to-date.  Due 7/24.  Referral sent. Last Pap 8/23.NILM, HPV negative.  Due 08/16/2024.  Follows with OB/GYN Tdap up-to-date Shingles up-to-date Colonoscopy up-to-date.  Due 09/2027  PDMP reviewed  Return in about 4 weeks (around 05/21/2022) for PCP.  Dana Allan, MD

## 2022-04-23 NOTE — Patient Instructions (Addendum)
It was a pleasure meeting you today. Thank you for allowing me to take part in your health care.  Our goals for today as we discussed include:  We will get some labs today.  If they are abnormal or we need to do something about them, I will call you.  If they are normal, I will send you a message on MyChart (if it is active) or a letter in the mail.  If you don't hear from [place] in 2 weeks, please call the office at the number below.   Mammogram due July 2024.  Referral sent to [facility] breast center  Schedule follow-up appointment with PCP in 4 weeks.   If you have any questions or concerns, please do not hesitate to call the office at [phone].  I look forward to our next visit and until then take care and stay safe.  Regards,   Dana Allan, MD   [facility]

## 2022-04-24 ENCOUNTER — Other Ambulatory Visit: Payer: Self-pay | Admitting: Family Medicine

## 2022-04-24 DIAGNOSIS — E538 Deficiency of other specified B group vitamins: Secondary | ICD-10-CM

## 2022-04-24 DIAGNOSIS — E559 Vitamin D deficiency, unspecified: Secondary | ICD-10-CM

## 2022-04-24 LAB — CBC WITH DIFFERENTIAL/PLATELET
Basophils Absolute: 0 10*3/uL (ref 0.0–0.1)
Basophils Relative: 0.9 % (ref 0.0–3.0)
Eosinophils Absolute: 0.2 10*3/uL (ref 0.0–0.7)
Eosinophils Relative: 5 % (ref 0.0–5.0)
HCT: 40.1 % (ref 36.0–46.0)
Hemoglobin: 13.4 g/dL (ref 12.0–15.0)
Lymphocytes Relative: 48.8 % — ABNORMAL HIGH (ref 12.0–46.0)
Lymphs Abs: 1.9 10*3/uL (ref 0.7–4.0)
MCHC: 33.3 g/dL (ref 30.0–36.0)
MCV: 87.8 fl (ref 78.0–100.0)
Monocytes Absolute: 0.2 10*3/uL (ref 0.1–1.0)
Monocytes Relative: 5.9 % (ref 3.0–12.0)
Neutro Abs: 1.6 10*3/uL (ref 1.4–7.7)
Neutrophils Relative %: 39.4 % — ABNORMAL LOW (ref 43.0–77.0)
Platelets: 178 10*3/uL (ref 150.0–400.0)
RBC: 4.57 Mil/uL (ref 3.87–5.11)
RDW: 14.4 % (ref 11.5–15.5)
WBC: 4 10*3/uL (ref 4.0–10.5)

## 2022-04-24 LAB — COMPREHENSIVE METABOLIC PANEL
ALT: 23 U/L (ref 0–35)
AST: 21 U/L (ref 0–37)
Albumin: 4.2 g/dL (ref 3.5–5.2)
Alkaline Phosphatase: 80 U/L (ref 39–117)
BUN: 13 mg/dL (ref 6–23)
CO2: 28 mEq/L (ref 19–32)
Calcium: 9.6 mg/dL (ref 8.4–10.5)
Chloride: 103 mEq/L (ref 96–112)
Creatinine, Ser: 0.97 mg/dL (ref 0.40–1.20)
GFR: 65.6 mL/min (ref >60.00)
Glucose, Bld: 79 mg/dL (ref 70–99)
Potassium: 3.8 mEq/L (ref 3.5–5.1)
Sodium: 139 mEq/L (ref 135–145)
Total Bilirubin: 0.8 mg/dL (ref 0.2–1.2)
Total Protein: 7.4 g/dL (ref 6.0–8.3)

## 2022-04-24 LAB — VITAMIN B12: Vitamin B-12: 166 pg/mL — ABNORMAL LOW (ref 211–911)

## 2022-04-24 LAB — HEPATITIS C ANTIBODY: Hepatitis C Ab: NONREACTIVE

## 2022-04-24 LAB — MAGNESIUM: Magnesium: 1.6 mg/dL (ref 1.5–2.5)

## 2022-04-24 LAB — VITAMIN D 25 HYDROXY (VIT D DEFICIENCY, FRACTURES): VITD: 56.44 ng/mL (ref 30.00–100.00)

## 2022-04-24 LAB — HEMOGLOBIN A1C: Hgb A1c MFr Bld: 5.8 % (ref 4.6–6.5)

## 2022-04-24 LAB — TSH: TSH: 1.19 u[IU]/mL (ref 0.35–5.50)

## 2022-04-24 LAB — HIV ANTIBODY (ROUTINE TESTING W REFLEX): HIV 1&2 Ab, 4th Generation: NONREACTIVE

## 2022-04-24 MED ORDER — VITAMIN B-12 1000 MCG SL SUBL
1000.0000 ug | SUBLINGUAL_TABLET | Freq: Every day | SUBLINGUAL | 3 refills | Status: DC
Start: 2022-04-24 — End: 2022-11-06
  Filled 2022-04-24 – 2022-05-12 (×3): qty 90, 90d supply, fill #0

## 2022-04-24 MED ORDER — VITAMIN D3 1.25 MG (50000 UT) PO CAPS
1.0000 | ORAL_CAPSULE | ORAL | 3 refills | Status: DC
Start: 2022-04-24 — End: 2022-04-24
  Filled 2022-04-24: qty 12, 84d supply, fill #0

## 2022-04-24 MED ORDER — VITAMIN D3 1.25 MG (50000 UT) PO CAPS
1.0000 | ORAL_CAPSULE | ORAL | 0 refills | Status: AC
Start: 2022-04-24 — End: ?
  Filled 2022-04-24 – 2022-05-12 (×3): qty 13, 390d supply, fill #0
  Filled 2022-07-22: qty 3, 90d supply, fill #0
  Filled 2022-10-15: qty 3, 90d supply, fill #1
  Filled 2023-02-08: qty 3, 90d supply, fill #2

## 2022-04-25 ENCOUNTER — Other Ambulatory Visit (HOSPITAL_COMMUNITY): Payer: Self-pay

## 2022-04-28 ENCOUNTER — Encounter: Payer: Self-pay | Admitting: Family Medicine

## 2022-04-28 DIAGNOSIS — Z1159 Encounter for screening for other viral diseases: Secondary | ICD-10-CM | POA: Insufficient documentation

## 2022-04-28 DIAGNOSIS — R001 Bradycardia, unspecified: Secondary | ICD-10-CM | POA: Insufficient documentation

## 2022-04-28 DIAGNOSIS — Z1231 Encounter for screening mammogram for malignant neoplasm of breast: Secondary | ICD-10-CM | POA: Insufficient documentation

## 2022-04-28 DIAGNOSIS — Z114 Encounter for screening for human immunodeficiency virus [HIV]: Secondary | ICD-10-CM | POA: Insufficient documentation

## 2022-04-28 NOTE — Assessment & Plan Note (Addendum)
Asymptomatic. Not currently on AV nodal blockers Sees cardiology Checking labs today

## 2022-04-28 NOTE — Assessment & Plan Note (Signed)
Check A1c. 

## 2022-04-28 NOTE — Assessment & Plan Note (Signed)
Currently on statin therapy and tolerating well.  Elevated lipoprotein a Continue Crestor 10 mg daily

## 2022-04-28 NOTE — Assessment & Plan Note (Signed)
Chronic.  Colonoscopy up-to-date.  Recommend follow-up in 10 years. Increase soluble fiber in diet Increase water intake Recommend MiraLAX 1 scoop as needed Recommend Metamucil daily

## 2022-04-28 NOTE — Assessment & Plan Note (Signed)
Noted on cardiac calcium score. Blood pressure well-controlled. Follows with cardiology.

## 2022-04-28 NOTE — Assessment & Plan Note (Signed)
BMI elevated Check labs Encouraged healthy diet and increase exercise

## 2022-05-08 ENCOUNTER — Other Ambulatory Visit: Payer: Self-pay

## 2022-05-08 ENCOUNTER — Other Ambulatory Visit (HOSPITAL_COMMUNITY): Payer: Self-pay

## 2022-05-13 ENCOUNTER — Other Ambulatory Visit (HOSPITAL_COMMUNITY): Payer: Self-pay

## 2022-05-13 ENCOUNTER — Other Ambulatory Visit: Payer: Self-pay

## 2022-05-15 ENCOUNTER — Other Ambulatory Visit (HOSPITAL_COMMUNITY): Payer: Self-pay

## 2022-05-20 DIAGNOSIS — Z83719 Family history of colon polyps, unspecified: Secondary | ICD-10-CM | POA: Insufficient documentation

## 2022-05-21 ENCOUNTER — Other Ambulatory Visit: Payer: Self-pay

## 2022-05-21 ENCOUNTER — Encounter: Payer: Self-pay | Admitting: Family Medicine

## 2022-05-21 ENCOUNTER — Other Ambulatory Visit (HOSPITAL_COMMUNITY): Payer: Self-pay

## 2022-05-21 ENCOUNTER — Ambulatory Visit (INDEPENDENT_AMBULATORY_CARE_PROVIDER_SITE_OTHER): Payer: Commercial Managed Care - PPO | Admitting: Family Medicine

## 2022-05-21 VITALS — BP 118/78 | HR 52 | Ht 68.0 in | Wt 244.0 lb

## 2022-05-21 DIAGNOSIS — E538 Deficiency of other specified B group vitamins: Secondary | ICD-10-CM

## 2022-05-21 DIAGNOSIS — E559 Vitamin D deficiency, unspecified: Secondary | ICD-10-CM

## 2022-05-21 DIAGNOSIS — R7303 Prediabetes: Secondary | ICD-10-CM | POA: Diagnosis not present

## 2022-05-21 DIAGNOSIS — Z0000 Encounter for general adult medical examination without abnormal findings: Secondary | ICD-10-CM | POA: Diagnosis not present

## 2022-05-21 DIAGNOSIS — K581 Irritable bowel syndrome with constipation: Secondary | ICD-10-CM | POA: Diagnosis not present

## 2022-05-21 DIAGNOSIS — K5909 Other constipation: Secondary | ICD-10-CM

## 2022-05-21 DIAGNOSIS — E785 Hyperlipidemia, unspecified: Secondary | ICD-10-CM

## 2022-05-21 DIAGNOSIS — E669 Obesity, unspecified: Secondary | ICD-10-CM

## 2022-05-21 MED ORDER — LINACLOTIDE 290 MCG PO CAPS
290.0000 ug | ORAL_CAPSULE | Freq: Every day | ORAL | 3 refills | Status: DC
Start: 2022-05-21 — End: 2023-06-05
  Filled 2022-05-21: qty 90, 90d supply, fill #0
  Filled 2022-09-05: qty 90, 90d supply, fill #1
  Filled 2022-10-15 – 2022-12-21 (×2): qty 90, 90d supply, fill #2
  Filled 2023-03-18: qty 90, 90d supply, fill #3

## 2022-05-21 NOTE — Progress Notes (Signed)
SUBJECTIVE:   Chief Complaint  Patient presents with   Annual Exam   HPI Patient presents to clinic for annual physical.  No acute concerns today.  Requesting refills for vitamin D3 and Linzess    PERTINENT PMH / PSH: Dyslipidemia Chronic constipation/irritable bowel  OBJECTIVE:  BP 118/78   Pulse (!) 52   Ht 5\' 8"  (1.727 m)   Wt 244 lb (110.7 kg)   SpO2 98%   BMI 37.10 kg/m    Physical Exam Vitals reviewed.  Constitutional:      General: She is not in acute distress.    Appearance: She is not ill-appearing.  HENT:     Head: Normocephalic.     Nose: Nose normal.  Eyes:     Conjunctiva/sclera: Conjunctivae normal.  Neck:     Thyroid: No thyromegaly or thyroid tenderness.  Cardiovascular:     Rate and Rhythm: Normal rate and regular rhythm.     Heart sounds: Normal heart sounds.  Pulmonary:     Effort: Pulmonary effort is normal.     Breath sounds: Normal breath sounds.  Abdominal:     General: Abdomen is flat. Bowel sounds are normal.     Palpations: Abdomen is soft.  Musculoskeletal:        General: Normal range of motion.     Cervical back: Normal range of motion.  Neurological:     Mental Status: She is alert and oriented to person, place, and time. Mental status is at baseline.  Psychiatric:        Mood and Affect: Mood normal.        Behavior: Behavior normal.        Thought Content: Thought content normal.        Judgment: Judgment normal.       04/23/2022    2:06 PM 01/05/2021    9:02 AM 01/05/2020    2:43 PM 12/30/2018    8:40 AM 10/01/2017    8:30 AM  Depression screen PHQ 2/9  Decreased Interest 0 0 0 0 0  Down, Depressed, Hopeless 0 0 0 0 0  PHQ - 2 Score 0 0 0 0 0  Altered sleeping 0  0    Tired, decreased energy 0  0    Change in appetite 0  0    Feeling bad or failure about yourself  0  0    Trouble concentrating 0  0    Moving slowly or fidgety/restless 0  0    Suicidal thoughts 0  0    PHQ-9 Score 0  0    Difficult doing  work/chores Not difficult at all  Not difficult at all         04/23/2022    2:06 PM 01/05/2020    2:43 PM  GAD 7 : Generalized Anxiety Score  Nervous, Anxious, on Edge 0 0  Control/stop worrying 0 0  Worry too much - different things 0 0  Trouble relaxing 0 0  Restless 0 0  Easily annoyed or irritable 0 0  Afraid - awful might happen 0 0  Total GAD 7 Score 0 0  Anxiety Difficulty Not difficult at all Not difficult at all      ASSESSMENT/PLAN:  Annual physical exam Assessment & Plan: HCM Mammogram up-to-date.  Due 7/24.  Referral sent. Last Pap 8/23.NILM, HPV negative.  Due 08/16/2024.  Follows with OB/GYN Tdap up-to-date Shingles up-to-date Colonoscopy up-to-date.  Due 09/2027 Depression/GAD screening completed Hypertension screening completed  Prediabetes Assessment & Plan: Chronic.  Asymptomatic.  Recent A1c 5.8 Encouraged healthy nutrition and lifestyle changes to include increased activity.     Vitamin D deficiency Assessment & Plan: Vitamin D low Refill vitamin D 1.25 mg monthly    B12 deficiency Assessment & Plan: Vitamin B12 low Start vitamin B12 1000 mcg daily    Irritable bowel syndrome with constipation Assessment & Plan: Chronic. Refill Linzess to 90 mg daily   Orders: -     linaCLOtide; Take 1 capsule (290 mcg total) by mouth daily before breakfast.  Dispense: 90 capsule; Refill: 3  Morbid obesity (HCC) Assessment & Plan: BMI greater than 35 with dyslipidemia Continue health nutrition and increase activity     PDMP reviewed  Return in about 1 year (around 05/21/2023) for annual visit with fasting labs 1 week prior, PCP.  Dana Allan, MD

## 2022-05-21 NOTE — Patient Instructions (Addendum)
It was a pleasure seeing you today. Thank you for allowing me to take part in your health care.  Our goals for today as we discussed include:  Pap due August 2026  Refill sent for requested medication  Continue vitamin D monthly Continue vitamin B12 daily   Follow-up with PCP in 1 year for annual physical.  Please schedule fasting lab appointment 1 week prior to office visit.  Fast for 10 hours.  If you have any questions or concerns, please do not hesitate to call the office at [phone].  I look forward to our next visit and until then take care and stay safe.  Regards,   Dana Allan, MD   [facility]

## 2022-06-07 NOTE — Assessment & Plan Note (Signed)
Chronic.  Asymptomatic.  Recent A1c 5.8 Encouraged healthy nutrition and lifestyle changes to include increased activity.

## 2022-06-07 NOTE — Assessment & Plan Note (Signed)
Vitamin B12 low Start vitamin B12 1000 mcg daily 

## 2022-06-07 NOTE — Assessment & Plan Note (Signed)
Chronic. Refill Linzess to 90 mg daily

## 2022-06-07 NOTE — Assessment & Plan Note (Addendum)
BMI greater than 35 with dyslipidemia Continue health nutrition and increase activity

## 2022-06-07 NOTE — Assessment & Plan Note (Signed)
Vitamin D low Refill vitamin D 1.25 mg monthly

## 2022-06-07 NOTE — Assessment & Plan Note (Signed)
HCM Mammogram up-to-date.  Due 7/24.  Referral sent. Last Pap 8/23.NILM, HPV negative.  Due 08/16/2024.  Follows with OB/GYN Tdap up-to-date Shingles up-to-date Colonoscopy up-to-date.  Due 09/2027 Depression/GAD screening completed Hypertension screening completed

## 2022-06-27 ENCOUNTER — Ambulatory Visit: Payer: Commercial Managed Care - PPO | Admitting: Family Medicine

## 2022-06-27 ENCOUNTER — Other Ambulatory Visit: Payer: Self-pay

## 2022-06-27 VITALS — BP 121/79 | HR 50 | Temp 98.0°F | Resp 16 | Ht 68.0 in | Wt 250.4 lb

## 2022-06-27 DIAGNOSIS — E669 Obesity, unspecified: Secondary | ICD-10-CM

## 2022-06-27 DIAGNOSIS — Z7689 Persons encountering health services in other specified circumstances: Secondary | ICD-10-CM

## 2022-06-27 DIAGNOSIS — E785 Hyperlipidemia, unspecified: Secondary | ICD-10-CM

## 2022-06-27 MED ORDER — PHENTERMINE HCL 37.5 MG PO TABS
37.5000 mg | ORAL_TABLET | Freq: Every day | ORAL | 0 refills | Status: DC
  Filled 2022-06-27 – 2022-07-05 (×2): qty 30, 30d supply, fill #0

## 2022-06-27 NOTE — Progress Notes (Signed)
Patient is here to established care with provider. ~health hx address ~care gaps address  

## 2022-06-28 ENCOUNTER — Other Ambulatory Visit (HOSPITAL_COMMUNITY): Payer: Self-pay

## 2022-07-01 ENCOUNTER — Encounter: Payer: Self-pay | Admitting: Family Medicine

## 2022-07-01 ENCOUNTER — Other Ambulatory Visit (HOSPITAL_COMMUNITY): Payer: Self-pay

## 2022-07-01 NOTE — Progress Notes (Signed)
New Patient Office Visit  Subjective    Patient ID: VERNE COVE, female    DOB: [birth year]  Age: 57 y.o. MRN: [id number]  CC:  Chief Complaint  Patient presents with   Establish Care    HPI [name] presents to establish care and for review of chronic med issues. Patient denies acute complaints.    Outpatient Encounter Medications as of 06/27/2022  Medication Sig   betamethasone, augmented, (DIPROLENE) 0.05 % lotion Apply to scalp 3 times per week as needed for itching.   Cholecalciferol (VITAMIN D3) 1.25 MG (50000 UT) CAPS Take 1 capsule (1.25 mg total) by mouth every 30 (thirty) days.   Cyanocobalamin (VITAMIN B-12) 1000 MCG SUBL Place 1 tablet (1,000 mcg total) under the tongue daily.   EPINEPHrine 0.3 mg/0.3 mL IJ SOAJ injection INJECT 1 PEN INTO THE MUSCLE AS NEEDED FOR ANAPHYLAXIS   hydrocortisone 2.5 % cream Apply 1 Application topically 2 (two) times daily. PRN   linaclotide (LINZESS) 290 MCG CAPS capsule Take 1 capsule (290 mcg total) by mouth daily before breakfast.   phentermine (ADIPEX-P) 37.5 MG tablet Take 1 tablet (37.5 mg total) by mouth daily before breakfast.   rosuvastatin (CRESTOR) 10 MG tablet Take 1 tablet (10 mg total) by mouth daily.   No facility-administered encounter medications on file as of 06/27/2022.    Past Medical History:  Diagnosis Date   Bilateral chronic knee pain    BRCA negative    History of abnormal Pap smear    Hyperlipidemia    Vitamin D deficiency     Past Surgical History:  Procedure Laterality Date   BREAST EXCISIONAL BIOPSY Left 2000   benign   BREAST LUMPECTOMY     bx 2/2 clogged milk duct 1.5 years after nursing daughter    CESAREAN SECTION  1996   foot surgey     01/2021   LEEP     ?2014    Family History  Problem Relation Age of Onset   Uterine cancer Mother    Diabetes Mother    Diverticulitis Mother    Cancer Mother        uterine   Hypertension Mother    Hypertension Father     Colon polyps Father    Hyperlipidemia Father    Colonic polyp Father    Arthritis Father    Diabetes Maternal Grandmother    Diabetes Maternal Grandfather    Breast cancer Paternal Grandmother 53       deceased at 68   Cancer Paternal Grandmother        breast   Bipolar disorder Daughter    Diabetes Maternal Uncle    Breast cancer Paternal Aunt        Paternal/unsure of age   Cancer Paternal Aunt        breast   Arthritis Paternal Aunt    Breast cancer Cousin 34        3 paternal cousin   Cancer Cousin        p side breast cancer    Breast cancer Cousin 40       paternal   Cancer Cousin        paternal side breast ca   Breast cancer Cousin        paternal   Uterine cancer Cousin        died age 26 Feb 24, 2020    Social History   Socioeconomic History   Marital status: Single    Spouse name:  Not on file   Number of children: Not on file   Years of education: Not on file   Highest education level: Bachelor's degree (e.g., BA, AB, BS)  Occupational History   Not on file  Tobacco Use   Smoking status: Never   Smokeless tobacco: Never  Vaping Use   Vaping Use: Never used  Substance and Sexual Activity   Alcohol use: No   Drug use: No   Sexual activity: Not Currently    Birth control/protection: None  Other Topics Concern   Not on file  Social History Narrative   Single    Bachelors degree    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 [employer]   Father from [place] grew up in [place]   1 daughter 82 y.o as of 07/01/17    Social Determinants of Health   Financial Resource Strain: Low Risk  (04/22/2022)   Overall Financial Resource Strain (CARDIA)    Difficulty of Paying Living Expenses: Not very hard  Food Insecurity: No Food Insecurity (04/22/2022)   Hunger Vital Sign    Worried About Running Out of Food in the Last Year: Never true    Ran Out of Food in the Last Year: Never true  Transportation Needs: No Transportation Needs (04/22/2022)   PRAPARE - Administrator, Civil Service (Medical):  No    Lack of Transportation (Non-Medical): No  Physical Activity: Insufficiently Active (04/22/2022)   Exercise Vital Sign    Days of Exercise per Week: 3 days    Minutes of Exercise per Session: 20 min  Stress: No Stress Concern Present (04/22/2022)   [employer] of Occupational Health - Occupational Stress Questionnaire    Feeling of Stress : Not at all  Social Connections: Unknown (04/22/2022)   Social Connection and Isolation Panel [NHANES]    Frequency of Communication with Friends and Family: More than three times a week    Frequency of Social Gatherings with Friends and Family: Once a week    Attends Religious Services: Patient declined    Database administrator or Organizations: No    Attends Engineer, structural: Not on file    Marital Status: Never married  Intimate Partner Violence: Not on file    Review of Systems  All other systems reviewed and are negative.       Objective    BP 121/79   Pulse (!) 50   Temp 98 F (36.7 C) (Oral)   Resp 16   Ht 5\' 8"  (1.727 m)   Wt 250 lb 6.4 oz (113.6 kg)   SpO2 90%   BMI 38.07 kg/m   Physical Exam Vitals and nursing note reviewed.  Constitutional:      General: She is not in acute distress.    Appearance: She is obese.  Cardiovascular:     Rate and Rhythm: Normal rate and regular rhythm.  Pulmonary:     Effort: Pulmonary effort is normal.     Breath sounds: Normal breath sounds.  Abdominal:     Palpations: Abdomen is soft.     Tenderness: There is no abdominal tenderness.  Neurological:     General: No focal deficit present.     Mental Status: She is alert and oriented to person, place, and time.         Assessment & Plan:   1. Hyperlipidemia, unspecified hyperlipidemia type Continue and monitor  2. Encounter for weight management Discussed dietary and activity options. Patient prescribed phentermine with goal of 3-5lbs wt loss/mo  3. Obesity (BMI 35.0-39.9 without comorbidity)   4.  Encounter to establish care    Return in about 4 weeks (around 07/25/2022) for follow up.   [name], MD

## 2022-07-03 ENCOUNTER — Other Ambulatory Visit (HOSPITAL_COMMUNITY): Payer: Self-pay

## 2022-07-05 ENCOUNTER — Other Ambulatory Visit (HOSPITAL_COMMUNITY): Payer: Self-pay

## 2022-07-05 ENCOUNTER — Other Ambulatory Visit: Payer: Self-pay

## 2022-07-22 ENCOUNTER — Other Ambulatory Visit (HOSPITAL_COMMUNITY): Payer: Self-pay

## 2022-07-31 ENCOUNTER — Other Ambulatory Visit: Payer: Self-pay | Admitting: Family Medicine

## 2022-07-31 DIAGNOSIS — Z1231 Encounter for screening mammogram for malignant neoplasm of breast: Secondary | ICD-10-CM

## 2022-08-05 ENCOUNTER — Other Ambulatory Visit (HOSPITAL_COMMUNITY): Payer: Self-pay

## 2022-08-05 ENCOUNTER — Ambulatory Visit: Payer: Commercial Managed Care - PPO | Admitting: Family Medicine

## 2022-08-05 ENCOUNTER — Encounter (HOSPITAL_COMMUNITY): Payer: Self-pay

## 2022-08-05 ENCOUNTER — Encounter: Payer: Self-pay | Admitting: Family Medicine

## 2022-08-05 VITALS — BP 115/77 | HR 60 | Temp 98.1°F | Resp 16 | Wt 237.4 lb

## 2022-08-05 DIAGNOSIS — Z7689 Persons encountering health services in other specified circumstances: Secondary | ICD-10-CM | POA: Diagnosis not present

## 2022-08-05 DIAGNOSIS — Z6836 Body mass index (BMI) 36.0-36.9, adult: Secondary | ICD-10-CM

## 2022-08-05 MED ORDER — PHENTERMINE HCL 37.5 MG PO TABS
37.5000 mg | ORAL_TABLET | Freq: Every day | ORAL | 0 refills | Status: DC
  Filled 2022-08-05: qty 30, 30d supply, fill #0

## 2022-08-05 NOTE — Progress Notes (Unsigned)
Patient came in for monthly weight check. Patient has no other concerns today

## 2022-08-07 ENCOUNTER — Encounter: Payer: Self-pay | Admitting: Family Medicine

## 2022-08-07 NOTE — Progress Notes (Signed)
Established Patient Office Visit  Subjective    Patient ID: [name], female    DOB: 11-16-1965  Age: 57 y.o. MRN: [id number]  CC:  Chief Complaint  Patient presents with   Weight Check    HPI [name] presents for routine weight management.    Outpatient Encounter Medications as of 08/05/2022  Medication Sig   betamethasone, augmented, (DIPROLENE) 0.05 % lotion Apply to scalp 3 times per week as needed for itching.   Cholecalciferol (VITAMIN D3) 1.25 MG (50000 UT) CAPS Take 1 capsule (1.25 mg total) by mouth every 30 (thirty) days.   Cyanocobalamin (VITAMIN B-12) 1000 MCG SUBL Place 1 tablet (1,000 mcg total) under the tongue daily.   EPINEPHrine 0.3 mg/0.3 mL IJ SOAJ injection INJECT 1 PEN INTO THE MUSCLE AS NEEDED FOR ANAPHYLAXIS   hydrocortisone 2.5 % cream Apply 1 Application topically 2 (two) times daily. PRN   linaclotide (LINZESS) 290 MCG CAPS capsule Take 1 capsule (290 mcg total) by mouth daily before breakfast.   rosuvastatin (CRESTOR) 10 MG tablet Take 1 tablet (10 mg total) by mouth daily.   [DISCONTINUED] phentermine (ADIPEX-P) 37.5 MG tablet Take 1 tablet (37.5 mg total) by mouth daily before breakfast.   phentermine (ADIPEX-P) 37.5 MG tablet Take 1 tablet (37.5 mg total) by mouth daily before breakfast.   No facility-administered encounter medications on file as of 08/05/2022.    Past Medical History:  Diagnosis Date   Bilateral chronic knee pain    BRCA negative    History of abnormal Pap smear    Hyperlipidemia    Vitamin D deficiency     Past Surgical History:  Procedure Laterality Date   BREAST EXCISIONAL BIOPSY Left 2000   benign   BREAST LUMPECTOMY     bx 2/2 clogged milk duct 1.5 years after nursing daughter    CESAREAN SECTION  1996   foot surgey     01/2021   LEEP     ?2014    Family History  Problem Relation Age of Onset   Uterine cancer Mother    Diabetes Mother    Diverticulitis Mother    Cancer Mother         uterine   Hypertension Mother    Hypertension Father    Colon polyps Father    Hyperlipidemia Father    Colonic polyp Father    Arthritis Father    Diabetes Maternal Grandmother    Diabetes Maternal Grandfather    Breast cancer Paternal Grandmother 65       deceased at 27   Cancer Paternal Grandmother        breast   Bipolar disorder Daughter    Diabetes Maternal Uncle    Breast cancer Paternal Aunt        Paternal/unsure of age   Cancer Paternal Aunt        breast   Arthritis Paternal Aunt    Breast cancer Cousin 65        3 paternal cousin   Cancer Cousin        p side breast cancer    Breast cancer Cousin 40       paternal   Cancer Cousin        paternal side breast ca   Breast cancer Cousin        paternal   Uterine cancer Cousin        died age 35 2020-02-14    Social History   Socioeconomic History  Marital status: Single    Spouse name: Not on file   Number of children: Not on file   Years of education: Not on file   Highest education level: Bachelor's degree (e.g., BA, AB, BS)  Occupational History   Not on file  Tobacco Use   Smoking status: Never   Smokeless tobacco: Never  Vaping Use   Vaping status: Never Used  Substance and Sexual Activity   Alcohol use: No   Drug use: No   Sexual activity: Not Currently    Birth control/protection: None  Other Topics Concern   Not on file  Social History Narrative   Single    Bachelors degree    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 [employer]   Father from [place] grew up in [place]   1 daughter 22 y.o as of 07/01/17    Social Determinants of Health   Financial Resource Strain: Low Risk  (04/22/2022)   Overall Financial Resource Strain (CARDIA)    Difficulty of Paying Living Expenses: Not very hard  Food Insecurity: No Food Insecurity (04/22/2022)   Hunger Vital Sign    Worried About Running Out of Food in the Last Year: Never true    Ran Out of Food in the Last Year: Never true  Transportation Needs: No Transportation Needs (04/22/2022)    PRAPARE - Administrator, Civil Service (Medical): No    Lack of Transportation (Non-Medical): No  Physical Activity: Insufficiently Active (04/22/2022)   Exercise Vital Sign    Days of Exercise per Week: 3 days    Minutes of Exercise per Session: 20 min  Stress: No Stress Concern Present (04/22/2022)   [employer] of Occupational Health - Occupational Stress Questionnaire    Feeling of Stress : Not at all  Social Connections: Unknown (04/22/2022)   Social Connection and Isolation Panel [NHANES]    Frequency of Communication with Friends and Family: More than three times a week    Frequency of Social Gatherings with Friends and Family: Once a week    Attends Religious Services: Patient declined    Database administrator or Organizations: No    Attends Engineer, structural: Not on file    Marital Status: Never married  Intimate Partner Violence: Not on file    Review of Systems  All other systems reviewed and are negative.       Objective    BP 115/77   Pulse 60   Temp 98.1 F (36.7 C) (Oral)   Resp 16   Wt 237 lb 6.4 oz (107.7 kg)   SpO2 98%   BMI 36.10 kg/m   Physical Exam Vitals and nursing note reviewed.  Constitutional:      General: She is not in acute distress.    Appearance: She is obese.  Cardiovascular:     Rate and Rhythm: Normal rate and regular rhythm.  Pulmonary:     Effort: Pulmonary effort is normal.     Breath sounds: Normal breath sounds.  Abdominal:     Palpations: Abdomen is soft.     Tenderness: There is no abdominal tenderness.  Neurological:     General: No focal deficit present.     Mental Status: She is alert and oriented to person, place, and time.         Assessment & Plan:   1. Encounter for weight management Doing well with present management. Phentermine refilled.   2. Class 2 severe obesity due to excess calories with serious comorbidity and body  mass index (BMI) of 36.0 to 36.9 in adult  [facility])     Return in about 4 weeks (around 09/02/2022) for follow up.   [name], MD

## 2022-08-29 ENCOUNTER — Ambulatory Visit: Payer: Commercial Managed Care - PPO

## 2022-08-29 ENCOUNTER — Ambulatory Visit
Admission: RE | Admit: 2022-08-29 | Discharge: 2022-08-29 | Disposition: A | Discharge status: Home / Self Care | Payer: Commercial Managed Care - PPO | Source: Ambulatory Visit | Attending: Family Medicine | Admitting: Family Medicine

## 2022-08-29 DIAGNOSIS — Z1231 Encounter for screening mammogram for malignant neoplasm of breast: Secondary | ICD-10-CM

## 2022-09-05 ENCOUNTER — Encounter: Payer: Self-pay | Admitting: Family Medicine

## 2022-09-05 ENCOUNTER — Other Ambulatory Visit (HOSPITAL_COMMUNITY): Payer: Self-pay

## 2022-09-05 ENCOUNTER — Ambulatory Visit: Payer: Commercial Managed Care - PPO | Admitting: Family Medicine

## 2022-09-05 VITALS — BP 91/52 | HR 67 | Temp 98.1°F | Resp 16 | Wt 234.0 lb

## 2022-09-05 DIAGNOSIS — Z6835 Body mass index (BMI) 35.0-35.9, adult: Secondary | ICD-10-CM

## 2022-09-05 DIAGNOSIS — Z7689 Persons encountering health services in other specified circumstances: Secondary | ICD-10-CM | POA: Diagnosis not present

## 2022-09-05 MED ORDER — PHENTERMINE HCL 37.5 MG PO TABS
37.5000 mg | ORAL_TABLET | Freq: Every day | ORAL | 0 refills | Status: DC
  Filled 2022-09-05: qty 30, 30d supply, fill #0

## 2022-09-05 NOTE — Progress Notes (Signed)
Patient came in for monthly weight check. Patient has no other concerns today

## 2022-09-06 ENCOUNTER — Encounter: Payer: Self-pay | Admitting: Family Medicine

## 2022-09-06 NOTE — Progress Notes (Signed)
Established Patient Office Visit  Subjective    Patient ID: [name], female    DOB: 07-12-1965  Age: 57 y.o. MRN: [id number]  CC:  Chief Complaint  Patient presents with   Weight Check    HPI [name] presents for weight management.    Outpatient Encounter Medications as of 09/05/2022  Medication Sig   betamethasone, augmented, (DIPROLENE) 0.05 % lotion Apply to scalp 3 times per week as needed for itching.   Cholecalciferol (VITAMIN D3) 1.25 MG (50000 UT) CAPS Take 1 capsule (1.25 mg total) by mouth every 30 (thirty) days.   Cyanocobalamin (VITAMIN B-12) 1000 MCG SUBL Place 1 tablet (1,000 mcg total) under the tongue daily.   EPINEPHrine 0.3 mg/0.3 mL IJ SOAJ injection INJECT 1 PEN INTO THE MUSCLE AS NEEDED FOR ANAPHYLAXIS   hydrocortisone 2.5 % cream Apply 1 Application topically 2 (two) times daily. PRN   linaclotide (LINZESS) 290 MCG CAPS capsule Take 1 capsule (290 mcg total) by mouth daily before breakfast.   rosuvastatin (CRESTOR) 10 MG tablet Take 1 tablet (10 mg total) by mouth daily.   [DISCONTINUED] phentermine (ADIPEX-P) 37.5 MG tablet Take 1 tablet (37.5 mg total) by mouth daily before breakfast.   phentermine (ADIPEX-P) 37.5 MG tablet Take 1 tablet (37.5 mg total) by mouth daily before breakfast.   No facility-administered encounter medications on file as of 09/05/2022.    Past Medical History:  Diagnosis Date   Bilateral chronic knee pain    BRCA negative    History of abnormal Pap smear    Hyperlipidemia    Vitamin D deficiency     Past Surgical History:  Procedure Laterality Date   BREAST EXCISIONAL BIOPSY Left 2000   benign   BREAST LUMPECTOMY     bx 2/2 clogged milk duct 1.5 years after nursing daughter    CESAREAN SECTION  1996   foot surgey     01/2021   LEEP     ?2014    Family History  Problem Relation Age of Onset   Uterine cancer Mother    Diabetes Mother    Diverticulitis Mother    Cancer Mother         uterine   Hypertension Mother    Hypertension Father    Colon polyps Father    Hyperlipidemia Father    Colonic polyp Father    Arthritis Father    Diabetes Maternal Grandmother    Diabetes Maternal Grandfather    Breast cancer Paternal Grandmother 27       deceased at 52   Cancer Paternal Grandmother        breast   Bipolar disorder Daughter    Diabetes Maternal Uncle    Breast cancer Paternal Aunt        Paternal/unsure of age   Cancer Paternal Aunt        breast   Arthritis Paternal Aunt    Breast cancer Cousin 27        3 paternal cousin   Cancer Cousin        p side breast cancer    Breast cancer Cousin 40       paternal   Cancer Cousin        paternal side breast ca   Breast cancer Cousin        paternal   Uterine cancer Cousin        died age 17 02-22-2020    Social History   Socioeconomic History  Marital status: Single    Spouse name: Not on file   Number of children: Not on file   Years of education: Not on file   Highest education level: Bachelor's degree (e.g., BA, AB, BS)  Occupational History   Not on file  Tobacco Use   Smoking status: Never   Smokeless tobacco: Never  Vaping Use   Vaping status: Never Used  Substance and Sexual Activity   Alcohol use: No   Drug use: No   Sexual activity: Not Currently    Birth control/protection: None  Other Topics Concern   Not on file  Social History Narrative   Single    Bachelors degree    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 [employer]   Father from [place] grew up in [place]   1 daughter 70 y.o as of 07/01/17    Social Determinants of Health   Financial Resource Strain: Low Risk  (04/22/2022)   Overall Financial Resource Strain (CARDIA)    Difficulty of Paying Living Expenses: Not very hard  Food Insecurity: No Food Insecurity (04/22/2022)   Hunger Vital Sign    Worried About Running Out of Food in the Last Year: Never true    Ran Out of Food in the Last Year: Never true  Transportation Needs: No Transportation Needs (04/22/2022)    PRAPARE - Administrator, Civil Service (Medical): No    Lack of Transportation (Non-Medical): No  Physical Activity: Insufficiently Active (04/22/2022)   Exercise Vital Sign    Days of Exercise per Week: 3 days    Minutes of Exercise per Session: 20 min  Stress: No Stress Concern Present (04/22/2022)   [employer] of Occupational Health - Occupational Stress Questionnaire    Feeling of Stress : Not at all  Social Connections: Unknown (04/22/2022)   Social Connection and Isolation Panel [NHANES]    Frequency of Communication with Friends and Family: More than three times a week    Frequency of Social Gatherings with Friends and Family: Once a week    Attends Religious Services: Patient declined    Database administrator or Organizations: No    Attends Engineer, structural: Not on file    Marital Status: Never married  Intimate Partner Violence: Not on file    Review of Systems  All other systems reviewed and are negative.       Objective    BP (!) 91/52   Pulse 67   Temp 98.1 F (36.7 C) (Oral)   Resp 16   Wt 234 lb (106.1 kg)   SpO2 97%   BMI 35.58 kg/m   Physical Exam Vitals and nursing note reviewed.  Constitutional:      General: She is not in acute distress.    Appearance: She is obese.  Cardiovascular:     Rate and Rhythm: Normal rate and regular rhythm.  Pulmonary:     Effort: Pulmonary effort is normal.     Breath sounds: Normal breath sounds.  Abdominal:     Palpations: Abdomen is soft.     Tenderness: There is no abdominal tenderness.  Neurological:     General: No focal deficit present.     Mental Status: She is alert and oriented to person, place, and time.         Assessment & Plan:   1. Encounter for weight management Doing well with present management. Phentermine refilled.   2. Class 2 severe obesity due to excess calories with serious comorbidity and body mass  index (BMI) of 35.0 to 35.9 in adult  [facility])     Return in about 4 weeks (around 10/03/2022) for follow up.   [name], MD

## 2022-10-02 ENCOUNTER — Other Ambulatory Visit (HOSPITAL_COMMUNITY): Payer: Self-pay

## 2022-10-02 ENCOUNTER — Encounter: Payer: Self-pay | Admitting: Family Medicine

## 2022-10-02 ENCOUNTER — Ambulatory Visit: Payer: Commercial Managed Care - PPO | Admitting: Family Medicine

## 2022-10-02 VITALS — BP 111/74 | HR 66 | Temp 98.7°F | Resp 16 | Wt 229.8 lb

## 2022-10-02 DIAGNOSIS — E6609 Other obesity due to excess calories: Secondary | ICD-10-CM

## 2022-10-02 DIAGNOSIS — Z7689 Persons encountering health services in other specified circumstances: Secondary | ICD-10-CM

## 2022-10-02 DIAGNOSIS — Z6834 Body mass index (BMI) 34.0-34.9, adult: Secondary | ICD-10-CM | POA: Diagnosis not present

## 2022-10-02 MED ORDER — PHENTERMINE HCL 37.5 MG PO TABS
37.5000 mg | ORAL_TABLET | Freq: Every day | ORAL | 0 refills | Status: DC
  Filled 2022-10-02: qty 30, 30d supply, fill #0

## 2022-10-02 NOTE — Progress Notes (Signed)
Established Patient Office Visit  Subjective    Patient ID: [name], female    DOB: February 18, 1965  Age: 57 y.o. MRN: [id number]  CC: No chief complaint on file.   HPI [name] presents for weight management. Patient denies acute complaints or concerns.   Outpatient Encounter Medications as of 10/02/2022  Medication Sig   betamethasone, augmented, (DIPROLENE) 0.05 % lotion Apply to scalp 3 times per week as needed for itching.   Cholecalciferol (VITAMIN D3) 1.25 MG (50000 UT) CAPS Take 1 capsule (1.25 mg total) by mouth every 30 (thirty) days.   Cyanocobalamin (VITAMIN B-12) 1000 MCG SUBL Place 1 tablet (1,000 mcg total) under the tongue daily.   EPINEPHrine 0.3 mg/0.3 mL IJ SOAJ injection INJECT 1 PEN INTO THE MUSCLE AS NEEDED FOR ANAPHYLAXIS   hydrocortisone 2.5 % cream Apply 1 Application topically 2 (two) times daily. PRN   linaclotide (LINZESS) 290 MCG CAPS capsule Take 1 capsule (290 mcg total) by mouth daily before breakfast.   phentermine (ADIPEX-P) 37.5 MG tablet Take 1 tablet (37.5 mg total) by mouth daily before breakfast.   rosuvastatin (CRESTOR) 10 MG tablet Take 1 tablet (10 mg total) by mouth daily.   [DISCONTINUED] phentermine (ADIPEX-P) 37.5 MG tablet Take 1 tablet (37.5 mg total) by mouth daily before breakfast.   No facility-administered encounter medications on file as of 10/02/2022.    Past Medical History:  Diagnosis Date   Bilateral chronic knee pain    BRCA negative    History of abnormal Pap smear    Hyperlipidemia    Vitamin D deficiency     Past Surgical History:  Procedure Laterality Date   BREAST EXCISIONAL BIOPSY Left 2000   benign   BREAST LUMPECTOMY     bx 2/2 clogged milk duct 1.5 years after nursing daughter    CESAREAN SECTION  1996   foot surgey     01/2021   LEEP     ?2014    Family History  Problem Relation Age of Onset   Uterine cancer Mother    Diabetes Mother    Diverticulitis Mother    Cancer Mother         uterine   Hypertension Mother    Hypertension Father    Colon polyps Father    Hyperlipidemia Father    Colonic polyp Father    Arthritis Father    Diabetes Maternal Grandmother    Diabetes Maternal Grandfather    Breast cancer Paternal Grandmother 71       deceased at 79   Cancer Paternal Grandmother        breast   Bipolar disorder Daughter    Diabetes Maternal Uncle    Breast cancer Paternal Aunt        Paternal/unsure of age   Cancer Paternal Aunt        breast   Arthritis Paternal Aunt    Breast cancer Cousin 21        3 paternal cousin   Cancer Cousin        p side breast cancer    Breast cancer Cousin 40       paternal   Cancer Cousin        paternal side breast ca   Breast cancer Cousin        paternal   Uterine cancer Cousin        died age 60 03-28-20    Social History   Socioeconomic History   Marital status:  Single    Spouse name: Not on file   Number of children: Not on file   Years of education: Not on file   Highest education level: Bachelor's degree (e.g., BA, AB, BS)  Occupational History   Not on file  Tobacco Use   Smoking status: Never   Smokeless tobacco: Never  Vaping Use   Vaping status: Never Used  Substance and Sexual Activity   Alcohol use: No   Drug use: No   Sexual activity: Not Currently    Birth control/protection: None  Other Topics Concern   Not on file  Social History Narrative   Single    Bachelors degree    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 [employer]   Father from [place] grew up in [place]   1 daughter 28 y.o as of 07/01/17    Social Determinants of Health   Financial Resource Strain: Low Risk  (04/22/2022)   Overall Financial Resource Strain (CARDIA)    Difficulty of Paying Living Expenses: Not very hard  Food Insecurity: No Food Insecurity (04/22/2022)   Hunger Vital Sign    Worried About Running Out of Food in the Last Year: Never true    Ran Out of Food in the Last Year: Never true  Transportation Needs: No Transportation Needs  (04/22/2022)   PRAPARE - Administrator, Civil Service (Medical): No    Lack of Transportation (Non-Medical): No  Physical Activity: Insufficiently Active (04/22/2022)   Exercise Vital Sign    Days of Exercise per Week: 3 days    Minutes of Exercise per Session: 20 min  Stress: No Stress Concern Present (04/22/2022)   [employer] of Occupational Health - Occupational Stress Questionnaire    Feeling of Stress : Not at all  Social Connections: Unknown (04/22/2022)   Social Connection and Isolation Panel [NHANES]    Frequency of Communication with Friends and Family: More than three times a week    Frequency of Social Gatherings with Friends and Family: Once a week    Attends Religious Services: Patient declined    Database administrator or Organizations: No    Attends Engineer, structural: Not on file    Marital Status: Never married  Intimate Partner Violence: Not on file    Review of Systems  All other systems reviewed and are negative.       Objective    BP 111/74   Pulse 66   Temp 98.7 F (37.1 C) (Oral)   Resp 16   Wt 229 lb 12.8 oz (104.2 kg)   SpO2 97%   BMI 34.94 kg/m   Physical Exam Vitals and nursing note reviewed.  Constitutional:      General: She is not in acute distress.    Appearance: She is obese.  Cardiovascular:     Rate and Rhythm: Normal rate and regular rhythm.  Pulmonary:     Effort: Pulmonary effort is normal.     Breath sounds: Normal breath sounds.  Abdominal:     Palpations: Abdomen is soft.     Tenderness: There is no abdominal tenderness.  Neurological:     General: No focal deficit present.     Mental Status: She is alert and oriented to person, place, and time.         Assessment & Plan:   Encounter for weight management  Class 1 obesity due to excess calories with serious comorbidity and body mass index (BMI) of 34.0 to 34.9 in adult  Other orders -  Phentermine HCl; Take 1 tablet (37.5 mg  total) by mouth daily before breakfast.  Dispense: 30 tablet; Refill: 0     No follow-ups on file.   [name], MD

## 2022-10-03 ENCOUNTER — Other Ambulatory Visit: Payer: Self-pay

## 2022-10-15 ENCOUNTER — Other Ambulatory Visit (HOSPITAL_COMMUNITY): Payer: Self-pay

## 2022-10-15 ENCOUNTER — Other Ambulatory Visit: Payer: Self-pay

## 2022-10-16 DIAGNOSIS — L720 Epidermal cyst: Secondary | ICD-10-CM | POA: Diagnosis not present

## 2022-10-21 ENCOUNTER — Other Ambulatory Visit (HOSPITAL_COMMUNITY): Payer: Self-pay

## 2022-10-21 DIAGNOSIS — H524 Presbyopia: Secondary | ICD-10-CM | POA: Diagnosis not present

## 2022-11-01 ENCOUNTER — Ambulatory Visit: Payer: Commercial Managed Care - PPO | Admitting: Family Medicine

## 2022-11-01 ENCOUNTER — Encounter: Payer: Self-pay | Admitting: Family Medicine

## 2022-11-01 VITALS — BP 132/85 | HR 54 | Temp 98.2°F | Resp 16 | Ht 68.0 in | Wt 230.8 lb

## 2022-11-01 DIAGNOSIS — E66812 Obesity, class 2: Secondary | ICD-10-CM

## 2022-11-01 DIAGNOSIS — Z7689 Persons encountering health services in other specified circumstances: Secondary | ICD-10-CM

## 2022-11-01 DIAGNOSIS — Z6835 Body mass index (BMI) 35.0-35.9, adult: Secondary | ICD-10-CM

## 2022-11-02 ENCOUNTER — Other Ambulatory Visit: Payer: Self-pay | Admitting: Family Medicine

## 2022-11-05 ENCOUNTER — Other Ambulatory Visit: Payer: Self-pay | Admitting: Family Medicine

## 2022-11-05 NOTE — Telephone Encounter (Signed)
Medication Refill - Medication:  phentermine (ADIPEX-P) 37.5 MG tablet   *completely out   *Patient states she was on hold with another agent and got disconnected.  Has the patient contacted their pharmacy? Yes, advised to contact PCP  Preferred Pharmacy (with phone number or street name):  [place] [facility]  Phone: [phone] Fax: [phone]   Has the patient been seen for an appointment in the last year OR does the patient have an upcoming appointment? Yes/ F/U scheduled for 12.3.24

## 2022-11-06 ENCOUNTER — Encounter: Payer: Self-pay | Admitting: Family Medicine

## 2022-11-06 MED ORDER — PHENTERMINE HCL 37.5 MG PO TABS: 37.5000 mg | ORAL_TABLET | Freq: Every day | ORAL | 0 refills | Status: DC

## 2022-11-06 NOTE — Progress Notes (Signed)
Established Patient Office Visit  Subjective    Patient ID: [name], female    DOB: 15-Apr-1965  Age: 57 y.o. MRN: [id number]  CC:  Chief Complaint  Patient presents with   Follow-up    HPI [name] presents for routine weight management. Patient denies acute complaints.   Outpatient Encounter Medications as of 11/01/2022  Medication Sig   betamethasone, augmented, (DIPROLENE) 0.05 % lotion Apply to scalp 3 times per week as needed for itching.   Cholecalciferol (VITAMIN D3) 1.25 MG (50000 UT) CAPS Take 1 capsule (1.25 mg total) by mouth every 30 (thirty) days.   EPINEPHrine 0.3 mg/0.3 mL IJ SOAJ injection INJECT 1 PEN INTO THE MUSCLE AS NEEDED FOR ANAPHYLAXIS   hydrocortisone 2.5 % cream Apply 1 Application topically 2 (two) times daily. PRN   linaclotide (LINZESS) 290 MCG CAPS capsule Take 1 capsule (290 mcg total) by mouth daily before breakfast.   rosuvastatin (CRESTOR) 10 MG tablet Take 1 tablet (10 mg total) by mouth daily.   Cyanocobalamin (VITAMIN B-12) 1000 MCG SUBL Place 1 tablet (1,000 mcg total) under the tongue daily.   phentermine (ADIPEX-P) 37.5 MG tablet Take 1 tablet (37.5 mg total) by mouth daily before breakfast.   [DISCONTINUED] phentermine (ADIPEX-P) 37.5 MG tablet Take 1 tablet (37.5 mg total) by mouth daily before breakfast.   No facility-administered encounter medications on file as of 11/01/2022.    Past Medical History:  Diagnosis Date   Bilateral chronic knee pain    BRCA negative    History of abnormal Pap smear    Hyperlipidemia    Vitamin D deficiency     Past Surgical History:  Procedure Laterality Date   BREAST EXCISIONAL BIOPSY Left 2000   benign   BREAST LUMPECTOMY     bx 2/2 clogged milk duct 1.5 years after nursing daughter    CESAREAN SECTION  1996   foot surgey     01/2021   LEEP     ?2014    Family History  Problem Relation Age of Onset   Uterine cancer Mother    Diabetes Mother    Diverticulitis  Mother    Cancer Mother        uterine   Hypertension Mother    Hypertension Father    Colon polyps Father    Hyperlipidemia Father    Colonic polyp Father    Arthritis Father    Diabetes Maternal Grandmother    Diabetes Maternal Grandfather    Breast cancer Paternal Grandmother 52       deceased at 61   Cancer Paternal Grandmother        breast   Bipolar disorder Daughter    Diabetes Maternal Uncle    Breast cancer Paternal Aunt        Paternal/unsure of age   Cancer Paternal Aunt        breast   Arthritis Paternal Aunt    Breast cancer Cousin 65        3 paternal cousin   Cancer Cousin        p side breast cancer    Breast cancer Cousin 40       paternal   Cancer Cousin        paternal side breast ca   Breast cancer Cousin        paternal   Uterine cancer Cousin        died age 27 Mar 09, 2020    Social History   Socioeconomic  History   Marital status: Single    Spouse name: Not on file   Number of children: Not on file   Years of education: Not on file   Highest education level: Bachelor's degree (e.g., BA, AB, BS)  Occupational History   Not on file  Tobacco Use   Smoking status: Never   Smokeless tobacco: Never  Vaping Use   Vaping status: Never Used  Substance and Sexual Activity   Alcohol use: No   Drug use: No   Sexual activity: Not Currently    Birth control/protection: None  Other Topics Concern   Not on file  Social History Narrative   Single    Bachelors degree    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 [employer]   Father from [place] grew up in [place]   1 daughter 103 y.o as of 07/01/17    Social Determinants of Health   Financial Resource Strain: Low Risk  (04/22/2022)   Overall Financial Resource Strain (CARDIA)    Difficulty of Paying Living Expenses: Not very hard  Food Insecurity: No Food Insecurity (04/22/2022)   Hunger Vital Sign    Worried About Running Out of Food in the Last Year: Never true    Ran Out of Food in the Last Year: Never true  Transportation Needs: No  Transportation Needs (04/22/2022)   PRAPARE - Administrator, Civil Service (Medical): No    Lack of Transportation (Non-Medical): No  Physical Activity: Insufficiently Active (04/22/2022)   Exercise Vital Sign    Days of Exercise per Week: 3 days    Minutes of Exercise per Session: 20 min  Stress: No Stress Concern Present (04/22/2022)   [employer] of Occupational Health - Occupational Stress Questionnaire    Feeling of Stress : Not at all  Social Connections: Unknown (04/22/2022)   Social Connection and Isolation Panel [NHANES]    Frequency of Communication with Friends and Family: More than three times a week    Frequency of Social Gatherings with Friends and Family: Once a week    Attends Religious Services: Patient declined    Database administrator or Organizations: No    Attends Engineer, structural: Not on file    Marital Status: Never married  Intimate Partner Violence: Not At Risk (11/01/2022)   Humiliation, Afraid, Rape, and Kick questionnaire    Fear of Current or Ex-Partner: No    Emotionally Abused: No    Physically Abused: No    Sexually Abused: No    Review of Systems  All other systems reviewed and are negative.       Objective    BP 132/85 (BP Location: Right Arm, Patient Position: Sitting, Cuff Size: Large)   Pulse (!) 54   Temp 98.2 F (36.8 C) (Oral)   Resp 16   Ht 5\' 8"  (1.727 m)   Wt 230 lb 12.8 oz (104.7 kg)   SpO2 96%   BMI 35.09 kg/m   Physical Exam Vitals and nursing note reviewed.  Constitutional:      General: She is not in acute distress.    Appearance: She is obese.  Cardiovascular:     Rate and Rhythm: Normal rate and regular rhythm.  Pulmonary:     Effort: Pulmonary effort is normal.     Breath sounds: Normal breath sounds.  Abdominal:     Palpations: Abdomen is soft.     Tenderness: There is no abdominal tenderness.  Neurological:     General: No focal deficit present.  Mental Status: She is  alert and oriented to person, place, and time.         Assessment & Plan:   Encounter for weight management  Class 2 severe obesity due to excess calories with serious comorbidity and body mass index (BMI) of 35.0 to 35.9 in adult [facility])  Other orders -     Phentermine HCl; Take 1 tablet (37.5 mg total) by mouth daily before breakfast.  Dispense: 30 tablet; Refill: 0     Return in about 4 weeks (around 11/29/2022) for follow up.   [name], MD

## 2022-11-06 NOTE — Telephone Encounter (Signed)
Requested medication (s) are due for refill today: No  Requested medication (s) are on the active medication list: yes    Last refill: 11/06/22    Future visit scheduled yes  12/10/22  Notes to clinic:Duplicate request. Cannot refuse non-delegated meds. Receipt confirmed by pharmacy 11/06/22 at 10:55  Requested Prescriptions  Pending Prescriptions Disp Refills   phentermine (ADIPEX-P) 37.5 MG tablet 30 tablet 0    Sig: Take 1 tablet (37.5 mg total) by mouth daily before breakfast.     Not Delegated - Neurology: Anticonvulsants - Controlled - phentermine hydrochloride Failed - 11/05/2022  4:14 PM      Failed - This refill cannot be delegated      Passed - eGFR in normal range and within 360 days    GFR calc Af Amer  Date Value Ref Range Status  02/09/2020 71 >59 mL/min/1.73 Final    Comment:    **In accordance with recommendations from the NKF-ASN Task force,**   [facility] is in the process of updating its eGFR calculation to the   2021 CKD-EPI creatinine equation that estimates kidney function   without a race variable.    GFR calc non Af Amer  Date Value Ref Range Status  02/09/2020 61 >59 mL/min/1.73 Final   GFR  Date Value Ref Range Status  04/23/2022 65.60 >60.00 mL/min Final    Comment:    Calculated using the CKD-EPI Creatinine Equation (2021)   eGFR  Date Value Ref Range Status  06/29/2020 65 >59 mL/min/1.73 Final         Passed - Cr in normal range and within 360 days    Creatinine, Ser  Date Value Ref Range Status  04/23/2022 0.97 0.40 - 1.20 mg/dL Final         Passed - Last BP in normal range    BP Readings from Last 1 Encounters:  11/01/22 132/85         Passed - Valid encounter within last 6 months    Recent Outpatient Visits           5 days ago Encounter for weight management   [place] [facility], MD   1 month ago Encounter for weight management   [place] [facility], MD   2 months ago Encounter for weight management   [place] [facility], MD   3 months ago Encounter for weight management   [place] [facility], MD   4 months ago Hyperlipidemia, unspecified hyperlipidemia type   [place] [facility], MD       Future Appointments             In 1 month Georganna Skeans, MD [facility] - Weight completed in the last 3 months    Wt Readings from Last 1 Encounters:  11/01/22 230 lb 12.8 oz (104.7 kg)

## 2022-12-10 ENCOUNTER — Ambulatory Visit: Payer: Commercial Managed Care - PPO | Admitting: Family Medicine

## 2022-12-10 ENCOUNTER — Other Ambulatory Visit (HOSPITAL_COMMUNITY): Payer: Self-pay

## 2022-12-10 ENCOUNTER — Encounter: Payer: Self-pay | Admitting: Family Medicine

## 2022-12-10 VITALS — BP 125/79 | HR 57 | Temp 97.8°F | Resp 16 | Ht 68.0 in | Wt 230.0 lb

## 2022-12-10 DIAGNOSIS — E66811 Obesity, class 1: Secondary | ICD-10-CM | POA: Diagnosis not present

## 2022-12-10 DIAGNOSIS — Z6834 Body mass index (BMI) 34.0-34.9, adult: Secondary | ICD-10-CM

## 2022-12-10 DIAGNOSIS — Z7689 Persons encountering health services in other specified circumstances: Secondary | ICD-10-CM

## 2022-12-10 DIAGNOSIS — E6609 Other obesity due to excess calories: Secondary | ICD-10-CM

## 2022-12-10 MED ORDER — PHENTERMINE HCL 37.5 MG PO TABS
37.5000 mg | ORAL_TABLET | Freq: Every day | ORAL | 0 refills | Status: DC
  Filled 2022-12-10: qty 30, 30d supply, fill #0

## 2022-12-12 ENCOUNTER — Encounter: Payer: Self-pay | Admitting: Family Medicine

## 2022-12-12 NOTE — Progress Notes (Signed)
Established Patient Office Visit  Subjective    Patient ID: [name], female    DOB: Oct 22, 1965  Age: 57 y.o. MRN: [id number]  CC:  Chief Complaint  Patient presents with   Follow-up    Stop breathing in her sleep    HPI Anne-Marie E Cradle presents for routine weight management. Patient denies acute complaints.   Outpatient Encounter Medications as of 12/10/2022  Medication Sig   betamethasone, augmented, (DIPROLENE) 0.05 % lotion Apply to scalp 3 times per week as needed for itching.   Cholecalciferol (VITAMIN D3) 1.25 MG (50000 UT) CAPS Take 1 capsule (1.25 mg total) by mouth every 30 (thirty) days.   [EXPIRED] EPINEPHrine 0.3 mg/0.3 mL IJ SOAJ injection INJECT 1 PEN INTO THE MUSCLE AS NEEDED FOR ANAPHYLAXIS   hydrocortisone 2.5 % cream Apply 1 Application topically 2 (two) times daily. PRN   linaclotide (LINZESS) 290 MCG CAPS capsule Take 1 capsule (290 mcg total) by mouth daily before breakfast.   rosuvastatin (CRESTOR) 10 MG tablet Take 1 tablet (10 mg total) by mouth daily.   [DISCONTINUED] phentermine (ADIPEX-P) 37.5 MG tablet Take 1 tablet (37.5 mg total) by mouth daily before breakfast.   phentermine (ADIPEX-P) 37.5 MG tablet Take 1 tablet (37.5 mg total) by mouth daily before breakfast.   No facility-administered encounter medications on file as of 12/10/2022.    Past Medical History:  Diagnosis Date   Bilateral chronic knee pain    BRCA negative    History of abnormal Pap smear    Hyperlipidemia    Vitamin D deficiency     Past Surgical History:  Procedure Laterality Date   BREAST EXCISIONAL BIOPSY Left 2000   benign   BREAST LUMPECTOMY     bx 2/2 clogged milk duct 1.5 years after nursing daughter    CESAREAN SECTION  1996   foot surgey     01/2021   LEEP     ?2014    Family History  Problem Relation Age of Onset   Uterine cancer Mother    Diabetes Mother    Diverticulitis Mother    Cancer Mother        uterine   Hypertension Mother     Hypertension Father    Colon polyps Father    Hyperlipidemia Father    Colonic polyp Father    Arthritis Father    Diabetes Maternal Grandmother    Diabetes Maternal Grandfather    Breast cancer Paternal Grandmother 73       deceased at 105   Cancer Paternal Grandmother        breast   Bipolar disorder Daughter    Diabetes Maternal Uncle    Breast cancer Paternal Aunt        Paternal/unsure of age   Cancer Paternal Aunt        breast   Arthritis Paternal Aunt    Breast cancer Cousin 96        3 paternal cousin   Cancer Cousin        p side breast cancer    Breast cancer Cousin 40       paternal   Cancer Cousin        paternal side breast ca   Breast cancer Cousin        paternal   Uterine cancer Cousin        died age 54 2020-03-03    Social History   Socioeconomic History   Marital status: Single  Spouse name: Not on file   Number of children: Not on file   Years of education: Not on file   Highest education level: Bachelor's degree (e.g., BA, AB, BS)  Occupational History   Not on file  Tobacco Use   Smoking status: Never   Smokeless tobacco: Never  Vaping Use   Vaping status: Never Used  Substance and Sexual Activity   Alcohol use: No   Drug use: No   Sexual activity: Not Currently    Birth control/protection: None  Other Topics Concern   Not on file  Social History Narrative   Single    Bachelors degree    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 [employer]   Father from [place] grew up in [place]   1 daughter 98 y.o as of 07/01/17    Social Determinants of Health   Financial Resource Strain: Low Risk  (04/22/2022)   Overall Financial Resource Strain (CARDIA)    Difficulty of Paying Living Expenses: Not very hard  Food Insecurity: No Food Insecurity (04/22/2022)   Hunger Vital Sign    Worried About Running Out of Food in the Last Year: Never true    Ran Out of Food in the Last Year: Never true  Transportation Needs: No Transportation Needs (04/22/2022)   PRAPARE - Therapist, art (Medical): No    Lack of Transportation (Non-Medical): No  Physical Activity: Insufficiently Active (04/22/2022)   Exercise Vital Sign    Days of Exercise per Week: 3 days    Minutes of Exercise per Session: 20 min  Stress: No Stress Concern Present (04/22/2022)   [employer] of Occupational Health - Occupational Stress Questionnaire    Feeling of Stress : Not at all  Social Connections: Unknown (04/22/2022)   Social Connection and Isolation Panel [NHANES]    Frequency of Communication with Friends and Family: More than three times a week    Frequency of Social Gatherings with Friends and Family: Once a week    Attends Religious Services: Patient declined    Database administrator or Organizations: No    Attends Engineer, structural: Not on file    Marital Status: Never married  Intimate Partner Violence: Not At Risk (11/01/2022)   Humiliation, Afraid, Rape, and Kick questionnaire    Fear of Current or Ex-Partner: No    Emotionally Abused: No    Physically Abused: No    Sexually Abused: No    Review of Systems  All other systems reviewed and are negative.       Objective    BP 125/79 (BP Location: Right Arm, Patient Position: Sitting, Cuff Size: Large)   Pulse (!) 57   Temp 97.8 F (36.6 C) (Oral)   Resp 16   Ht 5\' 8"  (1.727 m)   Wt 230 lb (104.3 kg)   SpO2 94%   BMI 34.97 kg/m   Physical Exam Vitals and nursing note reviewed.  Constitutional:      General: She is not in acute distress.    Appearance: She is obese.  Cardiovascular:     Rate and Rhythm: Normal rate and regular rhythm.  Pulmonary:     Effort: Pulmonary effort is normal.     Breath sounds: Normal breath sounds.  Abdominal:     Palpations: Abdomen is soft.     Tenderness: There is no abdominal tenderness.  Neurological:     General: No focal deficit present.     Mental Status: She is alert and oriented  to person, place, and time.         Assessment &  Plan:   Encounter for weight management  Class 1 obesity due to excess calories with serious comorbidity and body mass index (BMI) of 34.0 to 34.9 in adult  Other orders -     Phentermine HCl; Take 1 tablet (37.5 mg total) by mouth daily before breakfast.  Dispense: 30 tablet; Refill: 0     Return in about 4 weeks (around 01/07/2023) for follow up.   [name], MD

## 2022-12-19 ENCOUNTER — Other Ambulatory Visit: Payer: Self-pay | Admitting: *Deleted

## 2022-12-19 DIAGNOSIS — I77810 Thoracic aortic ectasia: Secondary | ICD-10-CM

## 2022-12-19 DIAGNOSIS — E785 Hyperlipidemia, unspecified: Secondary | ICD-10-CM

## 2022-12-23 ENCOUNTER — Other Ambulatory Visit: Payer: Self-pay

## 2023-01-16 ENCOUNTER — Other Ambulatory Visit: Payer: Self-pay

## 2023-01-17 ENCOUNTER — Ambulatory Visit: Payer: Commercial Managed Care - PPO | Admitting: Family Medicine

## 2023-01-17 ENCOUNTER — Other Ambulatory Visit: Payer: Self-pay

## 2023-01-17 VITALS — BP 117/80 | HR 90 | Temp 97.7°F | Resp 16 | Ht 68.0 in | Wt 229.0 lb

## 2023-01-17 DIAGNOSIS — E6609 Other obesity due to excess calories: Secondary | ICD-10-CM | POA: Diagnosis not present

## 2023-01-17 DIAGNOSIS — E66811 Obesity, class 1: Secondary | ICD-10-CM

## 2023-01-17 DIAGNOSIS — Z6834 Body mass index (BMI) 34.0-34.9, adult: Secondary | ICD-10-CM | POA: Diagnosis not present

## 2023-01-17 DIAGNOSIS — Z7689 Persons encountering health services in other specified circumstances: Secondary | ICD-10-CM | POA: Diagnosis not present

## 2023-01-17 MED ORDER — PHENTERMINE HCL 37.5 MG PO TABS
37.5000 mg | ORAL_TABLET | Freq: Every day | ORAL | 0 refills | Status: DC
  Filled 2023-01-17: qty 30, 30d supply, fill #0

## 2023-01-17 NOTE — Progress Notes (Signed)
 Established Patient Office Visit  Subjective    Patient ID: [name], female    DOB: 23-Feb-1965  Age: 58 y.o. MRN: [id number]  CC:  Chief Complaint  Patient presents with   Follow-up    1 month    HPI Anne-Marie E Ridolfi presents for routine weight management. Patient reports that she has been taking meds as recommended  Outpatient Encounter Medications as of 01/17/2023  Medication Sig   betamethasone , augmented, (DIPROLENE ) 0.05 % lotion Apply to scalp 3 times per week as needed for itching.   Cholecalciferol  (VITAMIN D3) 1.25 MG (50000 UT) CAPS Take 1 capsule (1.25 mg total) by mouth every 30 (thirty) days.   hydrocortisone  2.5 % cream Apply 1 Application topically 2 (two) times daily. PRN   linaclotide  (LINZESS ) 290 MCG CAPS capsule Take 1 capsule (290 mcg total) by mouth daily before breakfast.   rosuvastatin  (CRESTOR ) 10 MG tablet Take 1 tablet (10 mg total) by mouth daily.   [DISCONTINUED] phentermine  (ADIPEX-P ) 37.5 MG tablet Take 1 tablet (37.5 mg total) by mouth daily before breakfast.   phentermine  (ADIPEX-P ) 37.5 MG tablet Take 1 tablet (37.5 mg total) by mouth daily before breakfast.   No facility-administered encounter medications on file as of 01/17/2023.    Past Medical History:  Diagnosis Date   Bilateral chronic knee pain    BRCA negative    History of abnormal Pap smear    Hyperlipidemia    Vitamin D  deficiency     Past Surgical History:  Procedure Laterality Date   BREAST EXCISIONAL BIOPSY Left 2000   benign   BREAST LUMPECTOMY     bx 2/2 clogged milk duct 1.5 years after nursing daughter    CESAREAN SECTION  1996   foot surgey     01/2021   LEEP     ?2014    Family History  Problem Relation Age of Onset   Uterine cancer Mother    Diabetes Mother    Diverticulitis Mother    Cancer Mother        uterine   Hypertension Mother    Hypertension Father    Colon polyps Father    Hyperlipidemia Father    Colonic polyp Father     Arthritis Father    Diabetes Maternal Grandmother    Diabetes Maternal Grandfather    Breast cancer Paternal Grandmother 62       deceased at 65   Cancer Paternal Grandmother        breast   Bipolar disorder Daughter    Diabetes Maternal Uncle    Breast cancer Paternal Aunt        Paternal/unsure of age   Cancer Paternal Aunt        breast   Arthritis Paternal Aunt    Breast cancer Cousin 61        3 paternal cousin   Cancer Cousin        p side breast cancer    Breast cancer Cousin 40       paternal   Cancer Cousin        paternal side breast ca   Breast cancer Cousin        paternal   Uterine cancer Cousin        died age 37 23-Feb-2020    Social History   Socioeconomic History   Marital status: Single    Spouse name: Not on file   Number of children: Not on file   Years of education:  Not on file   Highest education level: Bachelor's degree (e.g., BA, AB, BS)  Occupational History   Not on file  Tobacco Use   Smoking status: Never   Smokeless tobacco: Never  Vaping Use   Vaping status: Never Used  Substance and Sexual Activity   Alcohol use: No   Drug use: No   Sexual activity: Not Currently    Birth control/protection: None  Other Topics Concern   Not on file  Social History Narrative   Single    Bachelors degree    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 [employer]   Father from [place] grew up in [place]   1 daughter 52 y.o as of 07/01/17    Social Drivers of Health   Financial Resource Strain: Low Risk  (01/17/2023)   Overall Financial Resource Strain (CARDIA)    Difficulty of Paying Living Expenses: Not hard at all  Food Insecurity: No Food Insecurity (01/17/2023)   Hunger Vital Sign    Worried About Running Out of Food in the Last Year: Never true    Ran Out of Food in the Last Year: Never true  Transportation Needs: No Transportation Needs (01/17/2023)   PRAPARE - Administrator, Civil Service (Medical): No    Lack of Transportation (Non-Medical): No  Physical Activity:  Sufficiently Active (01/17/2023)   Exercise Vital Sign    Days of Exercise per Week: 5 days    Minutes of Exercise per Session: 60 min  Stress: No Stress Concern Present (01/17/2023)   [employer] of Occupational Health - Occupational Stress Questionnaire    Feeling of Stress : Not at all  Social Connections: Unknown (01/17/2023)   Social Connection and Isolation Panel [NHANES]    Frequency of Communication with Friends and Family: More than three times a week    Frequency of Social Gatherings with Friends and Family: Twice a week    Attends Religious Services: Patient declined    Database Administrator or Organizations: No    Attends Engineer, Structural: Not on file    Marital Status: Never married  Intimate Partner Violence: Not At Risk (11/01/2022)   Humiliation, Afraid, Rape, and Kick questionnaire    Fear of Current or Ex-Partner: No    Emotionally Abused: No    Physically Abused: No    Sexually Abused: No    Review of Systems  All other systems reviewed and are negative.       Objective    BP 117/80 (BP Location: Right Arm, Patient Position: Sitting, Cuff Size: Large)   Pulse 90   Temp 97.7 F (36.5 C) (Oral)   Resp 16   Ht 5' 8 (1.727 m)   Wt 229 lb (103.9 kg)   SpO2 97%   BMI 34.82 kg/m   Physical Exam Vitals and nursing note reviewed.  Constitutional:      General: She is not in acute distress.    Appearance: She is obese.  Cardiovascular:     Rate and Rhythm: Normal rate and regular rhythm.  Pulmonary:     Effort: Pulmonary effort is normal.     Breath sounds: Normal breath sounds.  Abdominal:     Palpations: Abdomen is soft.     Tenderness: There is no abdominal tenderness.  Neurological:     General: No focal deficit present.     Mental Status: She is alert and oriented to person, place, and time.         Assessment & Plan:  Encounter for weight management  Class 1 obesity due to excess calories with serious comorbidity  and body mass index (BMI) of 34.0 to 34.9 in adult  Other orders -     Phentermine  HCl; Take 1 tablet (37.5 mg total) by mouth daily before breakfast.  Dispense: 30 tablet; Refill: 0     Return in about 4 weeks (around 02/14/2023) for follow up, weight management.   Tanda Raguel SQUIBB, MD

## 2023-01-20 ENCOUNTER — Encounter: Payer: Self-pay | Admitting: Family Medicine

## 2023-02-08 ENCOUNTER — Other Ambulatory Visit (HOSPITAL_COMMUNITY): Payer: Self-pay

## 2023-02-14 ENCOUNTER — Encounter: Payer: Self-pay | Admitting: Family Medicine

## 2023-02-14 ENCOUNTER — Other Ambulatory Visit (HOSPITAL_COMMUNITY): Payer: Self-pay

## 2023-02-14 ENCOUNTER — Ambulatory Visit: Payer: Commercial Managed Care - PPO | Admitting: Family Medicine

## 2023-02-14 VITALS — BP 118/80 | HR 74 | Temp 98.1°F | Resp 16 | Ht 68.0 in | Wt 230.6 lb

## 2023-02-14 DIAGNOSIS — E66812 Obesity, class 2: Secondary | ICD-10-CM

## 2023-02-14 DIAGNOSIS — Z6835 Body mass index (BMI) 35.0-35.9, adult: Secondary | ICD-10-CM | POA: Diagnosis not present

## 2023-02-14 DIAGNOSIS — Z7689 Persons encountering health services in other specified circumstances: Secondary | ICD-10-CM

## 2023-02-14 DIAGNOSIS — N950 Postmenopausal bleeding: Secondary | ICD-10-CM | POA: Diagnosis not present

## 2023-02-14 MED ORDER — PHENTERMINE HCL 37.5 MG PO TABS
37.5000 mg | ORAL_TABLET | Freq: Every day | ORAL | 0 refills | Status: DC
  Filled 2023-02-14: qty 30, 30d supply, fill #0

## 2023-02-18 ENCOUNTER — Encounter: Payer: Self-pay | Admitting: Family Medicine

## 2023-02-18 NOTE — Progress Notes (Signed)
 Established Patient Office Visit  Subjective    Patient ID: [name], female    DOB: Apr 20, 1965  Age: 58 y.o. MRN: [id number]  CC:  Chief Complaint  Patient presents with   Follow-up    Menstrual cycle has came on having concerns    HPI [name] presents with complaint of post menopausal bleeding. Patient's mom is also a uterine cancer survivor so patient is especially concerned.  Outpatient Encounter Medications as of 02/14/2023  Medication Sig   betamethasone , augmented, (DIPROLENE ) 0.05 % lotion Apply to scalp 3 times per week as needed for itching.   Cholecalciferol  (VITAMIN D3) 1.25 MG (50000 UT) CAPS Take 1 capsule (1.25 mg total) by mouth every 30 (thirty) days.   hydrocortisone  2.5 % cream Apply 1 Application topically 2 (two) times daily. PRN   linaclotide  (LINZESS ) 290 MCG CAPS capsule Take 1 capsule (290 mcg total) by mouth daily before breakfast.   rosuvastatin  (CRESTOR ) 10 MG tablet Take 1 tablet (10 mg total) by mouth daily.   [DISCONTINUED] phentermine  (ADIPEX-P ) 37.5 MG tablet Take 1 tablet (37.5 mg total) by mouth daily before breakfast.   phentermine  (ADIPEX-P ) 37.5 MG tablet Take 1 tablet (37.5 mg total) by mouth daily before breakfast.   No facility-administered encounter medications on file as of 02/14/2023.    Past Medical History:  Diagnosis Date   Bilateral chronic knee pain    BRCA negative    History of abnormal Pap smear    Hyperlipidemia    Vitamin D  deficiency     Past Surgical History:  Procedure Laterality Date   BREAST EXCISIONAL BIOPSY Left 2000   benign   BREAST LUMPECTOMY     bx 2/2 clogged milk duct 1.5 years after nursing daughter    CESAREAN SECTION  1996   foot surgey     01/2021   LEEP     ?2014    Family History  Problem Relation Age of Onset   Uterine cancer Mother    Diabetes Mother    Diverticulitis Mother    Cancer Mother        uterine   Hypertension Mother    Hypertension Father    Colon  polyps Father    Hyperlipidemia Father    Colonic polyp Father    Arthritis Father    Diabetes Maternal Grandmother    Diabetes Maternal Grandfather    Breast cancer Paternal Grandmother 83       deceased at 85   Cancer Paternal Grandmother        breast   Bipolar disorder Daughter    Diabetes Maternal Uncle    Breast cancer Paternal Aunt        Paternal/unsure of age   Cancer Paternal Aunt        breast   Arthritis Paternal Aunt    Breast cancer Cousin 54        3 paternal cousin   Cancer Cousin        p side breast cancer    Breast cancer Cousin 40       paternal   Cancer Cousin        paternal side breast ca   Breast cancer Cousin        paternal   Uterine cancer Cousin        died age 13 2020/02/25    Social History   Socioeconomic History   Marital status: Single    Spouse name: Not on file   Number  of children: Not on file   Years of education: Not on file   Highest education level: Bachelor's degree (e.g., BA, AB, BS)  Occupational History   Not on file  Tobacco Use   Smoking status: Never   Smokeless tobacco: Never  Vaping Use   Vaping status: Never Used  Substance and Sexual Activity   Alcohol use: No   Drug use: No   Sexual activity: Not Currently    Birth control/protection: None  Other Topics Concern   Not on file  Social History Narrative   Single    Bachelors degree    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 [employer]   Father from [place] grew up in [place]   1 daughter 21 y.o as of 07/01/17    Social Drivers of Health   Financial Resource Strain: Low Risk  (01/17/2023)   Overall Financial Resource Strain (CARDIA)    Difficulty of Paying Living Expenses: Not hard at all  Food Insecurity: No Food Insecurity (01/17/2023)   Hunger Vital Sign    Worried About Running Out of Food in the Last Year: Never true    Ran Out of Food in the Last Year: Never true  Transportation Needs: No Transportation Needs (01/17/2023)   PRAPARE - Administrator, Civil Service (Medical): No     Lack of Transportation (Non-Medical): No  Physical Activity: Sufficiently Active (01/17/2023)   Exercise Vital Sign    Days of Exercise per Week: 5 days    Minutes of Exercise per Session: 60 min  Stress: No Stress Concern Present (01/17/2023)   [employer] of Occupational Health - Occupational Stress Questionnaire    Feeling of Stress : Not at all  Social Connections: Unknown (01/17/2023)   Social Connection and Isolation Panel [NHANES]    Frequency of Communication with Friends and Family: More than three times a week    Frequency of Social Gatherings with Friends and Family: Twice a week    Attends Religious Services: Patient declined    Database Administrator or Organizations: No    Attends Engineer, Structural: Not on file    Marital Status: Never married  Intimate Partner Violence: Not At Risk (11/01/2022)   Humiliation, Afraid, Rape, and Kick questionnaire    Fear of Current or Ex-Partner: No    Emotionally Abused: No    Physically Abused: No    Sexually Abused: No    Review of Systems  All other systems reviewed and are negative.       Objective    BP 118/80 (BP Location: Right Arm, Patient Position: Sitting, Cuff Size: Large)   Pulse 74   Temp 98.1 F (36.7 C) (Oral)   Resp 16   Ht 5' 8 (1.727 m)   Wt 230 lb 9.6 oz (104.6 kg)   SpO2 98%   BMI 35.06 kg/m   Physical Exam Vitals and nursing note reviewed.  Constitutional:      General: She is not in acute distress.    Appearance: She is obese.  Cardiovascular:     Rate and Rhythm: Normal rate and regular rhythm.  Pulmonary:     Effort: Pulmonary effort is normal.     Breath sounds: Normal breath sounds.  Abdominal:     Palpations: Abdomen is soft.     Tenderness: There is no abdominal tenderness.  Neurological:     General: No focal deficit present.     Mental Status: She is alert and oriented to person, place, and time.  Assessment & Plan:   Post-menopausal bleeding -      Ambulatory referral to Obstetrics / Gynecology  Encounter for weight management  Class 2 severe obesity due to excess calories with serious comorbidity and body mass index (BMI) of 35.0 to 35.9 in adult [facility])  Other orders -     Phentermine  HCl; Take 1 tablet (37.5 mg total) by mouth daily before breakfast.  Dispense: 30 tablet; Refill: 0     Return in about 4 weeks (around 03/14/2023) for follow up, weight management.   Tanda Raguel SQUIBB, MD

## 2023-03-04 ENCOUNTER — Other Ambulatory Visit (HOSPITAL_COMMUNITY)
Admission: RE | Admit: 2023-03-04 | Discharge: 2023-03-04 | Disposition: A | Discharge status: Home / Self Care | Source: Ambulatory Visit | Attending: Obstetrics & Gynecology | Admitting: Obstetrics & Gynecology

## 2023-03-04 ENCOUNTER — Ambulatory Visit (INDEPENDENT_AMBULATORY_CARE_PROVIDER_SITE_OTHER): Payer: Commercial Managed Care - PPO | Admitting: Obstetrics & Gynecology

## 2023-03-04 ENCOUNTER — Other Ambulatory Visit (HOSPITAL_COMMUNITY)
Admission: RE | Admit: 2023-03-04 | Discharge: 2023-03-04 | Disposition: A | Discharge status: Home / Self Care | Payer: Commercial Managed Care - PPO | Source: Ambulatory Visit | Attending: Obstetrics & Gynecology | Admitting: Obstetrics & Gynecology

## 2023-03-04 ENCOUNTER — Encounter: Payer: Self-pay | Admitting: Obstetrics & Gynecology

## 2023-03-04 VITALS — BP 128/82 | HR 78 | Wt 236.4 lb

## 2023-03-04 DIAGNOSIS — N950 Postmenopausal bleeding: Secondary | ICD-10-CM | POA: Insufficient documentation

## 2023-03-04 DIAGNOSIS — Z01419 Encounter for gynecological examination (general) (routine) without abnormal findings: Secondary | ICD-10-CM | POA: Diagnosis not present

## 2023-03-04 NOTE — Progress Notes (Signed)
 GYNECOLOGY ANNUAL PREVENTATIVE CARE ENCOUNTER NOTE  History:     [name] is a 58 y.o. G1P1 PMP female here for a routine annual gynecologic exam.  Current complaints: has been menopausal since 2023 but then had a period that started on 02/04/23 and lasted for eight days.  No current bleeding, no inciting factors reported. She is concerned as her mother had a history of uterine cancer.   Denies any other  abnormal vaginal bleeding, discharge, pelvic pain, problems with intercourse or other gynecologic concerns.    Gynecologic History No LMP recorded. (Menstrual status: Other). Last Pap: 08/16/2021. Result was normal with negative HPV Last Mammogram: 08/29/2022.  Result was normal Last Colonoscopy: 09/26/2017.  Result was normal  Obstetric History OB History  Gravida Para Term Preterm AB Living  1 1    1   SAB IAB Ectopic Multiple Live Births      1    # Outcome Date GA Lbr Len/2nd Weight Sex Type Anes PTL Lv  1 Para     F CS-Classical   LIV    Past Medical History:  Diagnosis Date   Bilateral chronic knee pain    BRCA negative    History of abnormal Pap smear    Hyperlipidemia    Vitamin D deficiency     Past Surgical History:  Procedure Laterality Date   BREAST EXCISIONAL BIOPSY Left 2000   benign   BREAST LUMPECTOMY     bx 2/2 clogged milk duct 1.5 years after nursing daughter    CESAREAN SECTION  1996   foot surgey     01/2021   LEEP     ?2014    Current Outpatient Medications on File Prior to Visit  Medication Sig Dispense Refill   betamethasone, augmented, (DIPROLENE) 0.05 % lotion Apply to scalp 3 times per week as needed for itching. 60 mL 3   Cholecalciferol (VITAMIN D3) 1.25 MG (50000 UT) CAPS Take 1 capsule (1.25 mg total) by mouth every 30 (thirty) days. 13 capsule 0   hydrocortisone 2.5 % cream Apply 1 Application topically 2 (two) times daily. PRN     linaclotide (LINZESS) 290 MCG CAPS capsule Take 1 capsule (290 mcg total) by mouth daily  before breakfast. 90 capsule 3   phentermine (ADIPEX-P) 37.5 MG tablet Take 1 tablet (37.5 mg total) by mouth daily before breakfast. 30 tablet 0   rosuvastatin (CRESTOR) 10 MG tablet Take 1 tablet (10 mg total) by mouth daily. 90 tablet 3   No current facility-administered medications on file prior to visit.    Allergies  Allergen Reactions   Latex     sensitive   Other     Nuts tongue swelling  Berries rash    Shellfish Allergy Hives    Social History:  reports that she has never smoked. She has never used smokeless tobacco. She reports that she does not drink alcohol and does not use drugs.  Family History  Problem Relation Age of Onset   Uterine cancer Mother    Diabetes Mother    Diverticulitis Mother    Cancer Mother        uterine   Hypertension Mother    Hypertension Father    Colon polyps Father    Hyperlipidemia Father    Colonic polyp Father    Arthritis Father    Diabetes Maternal Grandmother    Diabetes Maternal Grandfather    Breast cancer Paternal Grandmother 34       deceased  at 80   Cancer Paternal Grandmother        breast   Bipolar disorder Daughter    Diabetes Maternal Uncle    Breast cancer Paternal Aunt        Paternal/unsure of age   Cancer Paternal Aunt        breast   Arthritis Paternal Aunt    Breast cancer Cousin 26        3 paternal cousin   Cancer Cousin        p side breast cancer    Breast cancer Cousin 40       paternal   Cancer Cousin        paternal side breast ca   Breast cancer Cousin        paternal   Uterine cancer Cousin        died age 56 February 20, 2020    The following portions of the patient's history were reviewed and updated as appropriate: allergies, current medications, past family history, past medical history, past social history, past surgical history and problem list.  Review of Systems Pertinent items noted in HPI and remainder of comprehensive ROS otherwise negative.  Physical Exam:  BP 128/82   Pulse 78    Wt 236 lb 6.4 oz (107.2 kg)   BMI 35.94 kg/m  CONSTITUTIONAL: Well-developed, well-nourished female in no acute distress.  HENT:  Normocephalic, atraumatic, External right and left ear normal.  EYES: Conjunctivae and EOM are normal. Pupils are equal, round, and reactive to light. No scleral icterus.  NECK: Normal range of motion, supple, no masses.  Normal thyroid.  SKIN: Skin is warm and dry. No rash noted. Not diaphoretic. No erythema. No pallor. MUSCULOSKELETAL: Normal range of motion. No tenderness.  No cyanosis, clubbing, or edema. NEUROLOGIC: Alert and oriented to person, place, and time. Normal reflexes, muscle tone coordination.  PSYCHIATRIC: Normal mood and affect. Normal behavior. Normal judgment and thought content. CARDIOVASCULAR: Normal heart rate noted, regular rhythm RESPIRATORY: Clear to auscultation bilaterally. Effort and breath sounds normal, no problems with respiration noted. BREASTS: Symmetric in size. No masses, tenderness, skin changes, nipple drainage, or lymphadenopathy bilaterally. Performed in the presence of a chaperone. ABDOMEN: Soft, no distention noted.  No tenderness, rebound or guarding.  PELVIC: Normal appearing external genitalia and urethral meatus; normal appearing vaginal mucosa and cervix.  No abnormal vaginal discharge noted.  Pap smear obtained.  Normal uterine size, no other palpable masses, no uterine or adnexal tenderness.  Performed in the presence of a chaperone.  ENDOMETRIAL BIOPSY     The indications for endometrial biopsy were reviewed.   Risks of the biopsy including cramping, bleeding, infection, uterine perforation, inadequate specimen and need for additional procedures  were discussed. The patient states she understands and agrees to undergo procedure today. Consent was signed. Time out was performed. Chaperone was present during entire procedure. During the pelvic exam, the cervix was prepped with Betadine. A paracervical block with 10 ml of  1% Lidocaine was administered.  A single-toothed tenaculum was placed on the anterior lip of the cervix to stabilize it. The 3 mm pipelle was introduced into the endometrial cavity without difficulty to a depth of 10 cm, and a moderate amount of tissue was obtained after two passes and sent to pathology. The instruments were removed from the patient's vagina. Minimal bleeding from the cervix was noted. The patient tolerated the procedure well. Routine post-procedure instructions were given to the patient.     Assessment and Plan:  1. Postmenopausal bleeding Discussed etiologies of postmenopausal bleeding, concern about precancerous/hyperplasia or cancerous etiology (5 to 10% percent of cases). Patient was assured that endometrial atrophy and endometrial polyps are the most common causes of postmenopausal bleeding.  Uterine bleeding in postmenopausal women is usually light and self-limited. Exclusion of cancer is the main objective; therefore, treatment is usually unnecessary once cancer has been excluded and if the episode is not recurrent.  The primary goal in the diagnostic evaluation of postmenopausal women with uterine bleeding is to exclude malignancy; this can include endometrial biopsy and pelvic ultrasound.   Further diagnostic evaluation is indicated for recurrent or persistent bleeding.  Will follow up all results and manage accordingly, bleeding precautions advised. - US PELVIC COMPLETE WITH TRANSVAGINAL; Future - Surgical pathology - Cytology - PAP - Cervicovaginal ancillary only  2. Well woman exam with routine gynecological exam (Primary) - Cytology - PAP Will follow up results of pap smear and manage accordingly. Normal breast examination today, she was advised to perform periodic self breast examinations.  Mammogram and colon cancer screening up to date. Routine preventative health maintenance measures emphasized. Please refer to After Visit Summary for other counseling  recommendations.      Jaynie Collins, MD, FACOG Obstetrician & Gynecologist, [facility] for [employer], [facility]

## 2023-03-05 ENCOUNTER — Encounter: Payer: Self-pay | Admitting: Obstetrics & Gynecology

## 2023-03-05 LAB — CERVICOVAGINAL ANCILLARY ONLY
Bacterial Vaginitis (gardnerella): NEGATIVE
Candida Glabrata: NEGATIVE
Candida Vaginitis: NEGATIVE
Chlamydia: NEGATIVE
Comment: NEGATIVE
Comment: NEGATIVE
Comment: NEGATIVE
Comment: NEGATIVE
Comment: NEGATIVE
Comment: NORMAL
Neisseria Gonorrhea: NEGATIVE
Trichomonas: NEGATIVE

## 2023-03-06 LAB — SURGICAL PATHOLOGY

## 2023-03-10 ENCOUNTER — Encounter: Payer: Self-pay | Admitting: Obstetrics & Gynecology

## 2023-03-10 LAB — CYTOLOGY - PAP
Comment: NEGATIVE
Diagnosis: NEGATIVE
Diagnosis: REACTIVE
High risk HPV: NEGATIVE

## 2023-03-18 ENCOUNTER — Other Ambulatory Visit: Payer: Self-pay

## 2023-03-19 ENCOUNTER — Ambulatory Visit
Admission: RE | Admit: 2023-03-19 | Discharge: 2023-03-19 | Disposition: A | Discharge status: Home / Self Care | Payer: Commercial Managed Care - PPO | Source: Ambulatory Visit | Attending: Obstetrics & Gynecology | Admitting: Obstetrics & Gynecology

## 2023-03-19 DIAGNOSIS — N8500 Endometrial hyperplasia, unspecified: Secondary | ICD-10-CM | POA: Diagnosis not present

## 2023-03-19 DIAGNOSIS — D251 Intramural leiomyoma of uterus: Secondary | ICD-10-CM | POA: Diagnosis not present

## 2023-03-19 DIAGNOSIS — N950 Postmenopausal bleeding: Secondary | ICD-10-CM

## 2023-03-20 ENCOUNTER — Other Ambulatory Visit (HOSPITAL_COMMUNITY): Payer: Self-pay

## 2023-03-20 ENCOUNTER — Encounter: Payer: Self-pay | Admitting: Obstetrics & Gynecology

## 2023-03-20 ENCOUNTER — Encounter: Payer: Self-pay | Admitting: Family Medicine

## 2023-03-20 ENCOUNTER — Ambulatory Visit: Payer: Commercial Managed Care - PPO | Admitting: Family Medicine

## 2023-03-20 ENCOUNTER — Other Ambulatory Visit: Payer: Self-pay

## 2023-03-20 VITALS — BP 117/77 | HR 65 | Temp 97.6°F | Resp 16 | Ht 68.0 in | Wt 229.2 lb

## 2023-03-20 DIAGNOSIS — E6609 Other obesity due to excess calories: Secondary | ICD-10-CM | POA: Diagnosis not present

## 2023-03-20 DIAGNOSIS — Z6834 Body mass index (BMI) 34.0-34.9, adult: Secondary | ICD-10-CM

## 2023-03-20 DIAGNOSIS — Z7689 Persons encountering health services in other specified circumstances: Secondary | ICD-10-CM | POA: Diagnosis not present

## 2023-03-20 DIAGNOSIS — E66811 Obesity, class 1: Secondary | ICD-10-CM

## 2023-03-20 MED ORDER — PHENTERMINE HCL 37.5 MG PO TABS
37.5000 mg | ORAL_TABLET | Freq: Every day | ORAL | 0 refills | Status: DC
  Filled 2023-03-20: qty 30, 30d supply, fill #0

## 2023-03-20 NOTE — Progress Notes (Signed)
 Established Patient Office Visit  Subjective    Patient ID: [name], female    DOB: 16-Jan-1965  Age: 58 y.o. MRN: [id number]  CC:  Chief Complaint  Patient presents with   Follow-up    One month    HPI [name] presents for routine med management. Patient denies acute complaints.   Outpatient Encounter Medications as of 03/20/2023  Medication Sig   betamethasone, augmented, (DIPROLENE) 0.05 % lotion Apply to scalp 3 times per week as needed for itching.   Cholecalciferol (VITAMIN D3) 1.25 MG (50000 UT) CAPS Take 1 capsule (1.25 mg total) by mouth every 30 (thirty) days.   hydrocortisone 2.5 % cream Apply 1 Application topically 2 (two) times daily. PRN   linaclotide (LINZESS) 290 MCG CAPS capsule Take 1 capsule (290 mcg total) by mouth daily before breakfast.   rosuvastatin (CRESTOR) 10 MG tablet Take 1 tablet (10 mg total) by mouth daily.   [DISCONTINUED] phentermine (ADIPEX-P) 37.5 MG tablet Take 1 tablet (37.5 mg total) by mouth daily before breakfast.   phentermine (ADIPEX-P) 37.5 MG tablet Take 1 tablet (37.5 mg total) by mouth daily before breakfast.   No facility-administered encounter medications on file as of 03/20/2023.    Past Medical History:  Diagnosis Date   Bilateral chronic knee pain    BRCA negative    History of abnormal Pap smear    Hyperlipidemia    Vitamin D deficiency     Past Surgical History:  Procedure Laterality Date   BREAST EXCISIONAL BIOPSY Left 2000   benign   BREAST LUMPECTOMY     bx 2/2 clogged milk duct 1.5 years after nursing daughter    CESAREAN SECTION  1996   foot surgey     01/2021   LEEP     ?2014    Family History  Problem Relation Age of Onset   Uterine cancer Mother    Diabetes Mother    Diverticulitis Mother    Cancer Mother        uterine   Hypertension Mother    Hypertension Father    Colon polyps Father    Hyperlipidemia Father    Colonic polyp Father    Arthritis Father    Diabetes  Maternal Grandmother    Diabetes Maternal Grandfather    Breast cancer Paternal Grandmother 13       deceased at 3   Cancer Paternal Grandmother        breast   Bipolar disorder Daughter    Diabetes Maternal Uncle    Breast cancer Paternal Aunt        Paternal/unsure of age   Cancer Paternal Aunt        breast   Arthritis Paternal Aunt    Breast cancer Cousin 8        3 paternal cousin   Cancer Cousin        p side breast cancer    Breast cancer Cousin 40       paternal   Cancer Cousin        paternal side breast ca   Breast cancer Cousin        paternal   Uterine cancer Cousin        died age 72 2020-03-24    Social History   Socioeconomic History   Marital status: Single    Spouse name: Not on file   Number of children: Not on file   Years of education: Not on file  Highest education level: Bachelor's degree (e.g., BA, AB, BS)  Occupational History   Not on file  Tobacco Use   Smoking status: Never   Smokeless tobacco: Never  Vaping Use   Vaping status: Never Used  Substance and Sexual Activity   Alcohol use: No   Drug use: No   Sexual activity: Not Currently    Birth control/protection: None  Other Topics Concern   Not on file  Social History Narrative   Single    Bachelors degree    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 [employer]   Father from [place] grew up in [place]   1 daughter 66 y.o as of 07/01/17    Social Drivers of Health   Financial Resource Strain: Low Risk  (01/17/2023)   Overall Financial Resource Strain (CARDIA)    Difficulty of Paying Living Expenses: Not hard at all  Food Insecurity: No Food Insecurity (01/17/2023)   Hunger Vital Sign    Worried About Running Out of Food in the Last Year: Never true    Ran Out of Food in the Last Year: Never true  Transportation Needs: No Transportation Needs (01/17/2023)   PRAPARE - Administrator, Civil Service (Medical): No    Lack of Transportation (Non-Medical): No  Physical Activity: Sufficiently Active (01/17/2023)    Exercise Vital Sign    Days of Exercise per Week: 5 days    Minutes of Exercise per Session: 60 min  Stress: No Stress Concern Present (01/17/2023)   [employer] of Occupational Health - Occupational Stress Questionnaire    Feeling of Stress : Not at all  Social Connections: Unknown (01/17/2023)   Social Connection and Isolation Panel [NHANES]    Frequency of Communication with Friends and Family: More than three times a week    Frequency of Social Gatherings with Friends and Family: Twice a week    Attends Religious Services: Patient declined    Database administrator or Organizations: No    Attends Engineer, structural: Not on file    Marital Status: Never married  Intimate Partner Violence: Not At Risk (11/01/2022)   Humiliation, Afraid, Rape, and Kick questionnaire    Fear of Current or Ex-Partner: No    Emotionally Abused: No    Physically Abused: No    Sexually Abused: No    Review of Systems  All other systems reviewed and are negative.       Objective    BP 117/77   Pulse 65   Temp 97.6 F (36.4 C) (Oral)   Resp 16   Ht 5\' 8"  (1.727 m)   Wt 229 lb 3.2 oz (104 kg)   SpO2 98%   BMI 34.85 kg/m   Physical Exam Vitals and nursing note reviewed.  Constitutional:      General: She is not in acute distress.    Appearance: She is obese.  Cardiovascular:     Rate and Rhythm: Normal rate and regular rhythm.  Pulmonary:     Effort: Pulmonary effort is normal.     Breath sounds: Normal breath sounds.  Abdominal:     Palpations: Abdomen is soft.     Tenderness: There is no abdominal tenderness.  Neurological:     General: No focal deficit present.     Mental Status: She is alert and oriented to person, place, and time.         Assessment & Plan:   Encounter for weight management  Class 1 obesity due to excess calories with  serious comorbidity and body mass index (BMI) of 34.0 to 34.9 in adult  Other orders -     Phentermine HCl; Take 1  tablet (37.5 mg total) by mouth daily before breakfast.  Dispense: 30 tablet; Refill: 0     No follow-ups on file.   Patricia Raymond, MD

## 2023-04-21 ENCOUNTER — Other Ambulatory Visit: Payer: Self-pay | Admitting: Internal Medicine

## 2023-04-22 ENCOUNTER — Other Ambulatory Visit (HOSPITAL_COMMUNITY): Payer: Self-pay

## 2023-04-22 MED ORDER — ROSUVASTATIN CALCIUM 10 MG PO TABS
10.0000 mg | ORAL_TABLET | Freq: Every day | ORAL | 0 refills | Status: DC
  Filled 2023-04-22: qty 30, 30d supply, fill #0

## 2023-04-26 ENCOUNTER — Other Ambulatory Visit (HOSPITAL_COMMUNITY): Payer: Self-pay

## 2023-04-28 ENCOUNTER — Other Ambulatory Visit (HOSPITAL_COMMUNITY): Payer: Self-pay

## 2023-04-29 ENCOUNTER — Other Ambulatory Visit (HOSPITAL_COMMUNITY): Payer: Self-pay

## 2023-04-30 ENCOUNTER — Other Ambulatory Visit (HOSPITAL_COMMUNITY): Payer: Self-pay

## 2023-04-30 ENCOUNTER — Other Ambulatory Visit: Payer: Self-pay

## 2023-05-01 ENCOUNTER — Other Ambulatory Visit (HOSPITAL_COMMUNITY): Payer: Self-pay

## 2023-06-05 ENCOUNTER — Encounter: Payer: Self-pay | Admitting: Family Medicine

## 2023-06-05 ENCOUNTER — Ambulatory Visit: Admitting: Family Medicine

## 2023-06-05 ENCOUNTER — Other Ambulatory Visit: Payer: Self-pay

## 2023-06-05 ENCOUNTER — Other Ambulatory Visit (HOSPITAL_COMMUNITY): Payer: Self-pay

## 2023-06-05 VITALS — BP 119/76 | HR 60

## 2023-06-05 DIAGNOSIS — E785 Hyperlipidemia, unspecified: Secondary | ICD-10-CM

## 2023-06-05 DIAGNOSIS — E6609 Other obesity due to excess calories: Secondary | ICD-10-CM

## 2023-06-05 DIAGNOSIS — E66811 Obesity, class 1: Secondary | ICD-10-CM

## 2023-06-05 DIAGNOSIS — Z6834 Body mass index (BMI) 34.0-34.9, adult: Secondary | ICD-10-CM

## 2023-06-05 DIAGNOSIS — K581 Irritable bowel syndrome with constipation: Secondary | ICD-10-CM | POA: Diagnosis not present

## 2023-06-05 DIAGNOSIS — Z7689 Persons encountering health services in other specified circumstances: Secondary | ICD-10-CM

## 2023-06-05 MED ORDER — LINACLOTIDE 290 MCG PO CAPS
290.0000 ug | ORAL_CAPSULE | Freq: Every day | ORAL | 3 refills | Status: AC
  Filled 2023-06-05: qty 90, 90d supply, fill #0
  Filled 2023-09-14: qty 90, 90d supply, fill #1

## 2023-06-05 MED ORDER — PHENTERMINE HCL 37.5 MG PO TABS
37.5000 mg | ORAL_TABLET | Freq: Every day | ORAL | 0 refills | Status: DC
  Filled 2023-06-05: qty 30, 30d supply, fill #0

## 2023-06-05 MED ORDER — ROSUVASTATIN CALCIUM 10 MG PO TABS
10.0000 mg | ORAL_TABLET | Freq: Every day | ORAL | 3 refills | Status: AC
  Filled 2023-06-05: qty 90, 90d supply, fill #0
  Filled 2023-09-02: qty 90, 90d supply, fill #1
  Filled 2023-11-27: qty 90, 90d supply, fill #2

## 2023-06-05 NOTE — Progress Notes (Signed)
 Established Patient Office Visit  Subjective    Patient ID: [name], female    DOB: 1965/01/08  Age: 58 y.o. MRN: [id number]  CC:  Chief Complaint  Patient presents with   Medication Refill    HPI [name] presents for routine weight management. Patient also would like refills of med Linzess  that were provided by her previous provider. Patient denies acute complaints.   Outpatient Encounter Medications as of 06/05/2023  Medication Sig   [DISCONTINUED] linaclotide  (LINZESS ) 290 MCG CAPS capsule Take 1 capsule (290 mcg total) by mouth daily before breakfast.   [DISCONTINUED] phentermine  (ADIPEX-P ) 37.5 MG tablet Take 1 tablet (37.5 mg total) by mouth daily before breakfast.   betamethasone , augmented, (DIPROLENE ) 0.05 % lotion Apply to scalp 3 times per week as needed for itching.   Cholecalciferol  (VITAMIN D3) 1.25 MG (50000 UT) CAPS Take 1 capsule (1.25 mg total) by mouth every 30 (thirty) days.   hydrocortisone  2.5 % cream Apply 1 Application topically 2 (two) times daily. PRN   linaclotide  (LINZESS ) 290 MCG CAPS capsule Take 1 capsule (290 mcg total) by mouth daily before breakfast.   phentermine  (ADIPEX-P ) 37.5 MG tablet Take 1 tablet (37.5 mg total) by mouth daily before breakfast.   rosuvastatin  (CRESTOR ) 10 MG tablet Take 1 tablet (10 mg total) by mouth daily.   [DISCONTINUED] rosuvastatin  (CRESTOR ) 10 MG tablet Take 1 tablet (10 mg total) by mouth daily.   No facility-administered encounter medications on file as of 06/05/2023.    Past Medical History:  Diagnosis Date   Bilateral chronic knee pain    BRCA negative    History of abnormal Pap smear    Hyperlipidemia    Vitamin D  deficiency     Past Surgical History:  Procedure Laterality Date   BREAST EXCISIONAL BIOPSY Left 2000   benign   BREAST LUMPECTOMY     bx 2/2 clogged milk duct 1.5 years after nursing daughter    CESAREAN SECTION  1996   foot surgey     01/2021   LEEP     ?2014     Family History  Problem Relation Age of Onset   Uterine cancer Mother    Diabetes Mother    Diverticulitis Mother    Cancer Mother        uterine   Hypertension Mother    Hypertension Father    Colon polyps Father    Hyperlipidemia Father    Colonic polyp Father    Arthritis Father    Diabetes Maternal Grandmother    Diabetes Maternal Grandfather    Breast cancer Paternal Grandmother 8       deceased at 74   Cancer Paternal Grandmother        breast   Bipolar disorder Daughter    Diabetes Maternal Uncle    Breast cancer Paternal Aunt        Paternal/unsure of age   Cancer Paternal Aunt        breast   Arthritis Paternal Aunt    Breast cancer Cousin 39        3 paternal cousin   Cancer Cousin        p side breast cancer    Breast cancer Cousin 40       paternal   Cancer Cousin        paternal side breast ca   Breast cancer Cousin        paternal   Uterine cancer Cousin  died age 66 02/2020    Social History   Socioeconomic History   Marital status: Single    Spouse name: Not on file   Number of children: Not on file   Years of education: Not on file   Highest education level: Bachelor's degree (e.g., BA, AB, BS)  Occupational History   Not on file  Tobacco Use   Smoking status: Never   Smokeless tobacco: Never  Vaping Use   Vaping status: Never Used  Substance and Sexual Activity   Alcohol use: No   Drug use: No   Sexual activity: Not Currently    Birth control/protection: None  Other Topics Concern   Not on file  Social History Narrative   Single    Bachelors degree    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 [employer]   Father from [place] grew up in [place]   1 daughter 6 y.o as of 07/01/17    Social Drivers of Health   Financial Resource Strain: Low Risk  (01/17/2023)   Overall Financial Resource Strain (CARDIA)    Difficulty of Paying Living Expenses: Not hard at all  Food Insecurity: No Food Insecurity (01/17/2023)   Hunger Vital Sign    Worried About Running Out of  Food in the Last Year: Never true    Ran Out of Food in the Last Year: Never true  Transportation Needs: No Transportation Needs (01/17/2023)   PRAPARE - Administrator, Civil Service (Medical): No    Lack of Transportation (Non-Medical): No  Physical Activity: Sufficiently Active (01/17/2023)   Exercise Vital Sign    Days of Exercise per Week: 5 days    Minutes of Exercise per Session: 60 min  Stress: No Stress Concern Present (01/17/2023)   [employer] of Occupational Health - Occupational Stress Questionnaire    Feeling of Stress : Not at all  Social Connections: Unknown (01/17/2023)   Social Connection and Isolation Panel [NHANES]    Frequency of Communication with Friends and Family: More than three times a week    Frequency of Social Gatherings with Friends and Family: Twice a week    Attends Religious Services: Patient declined    Database administrator or Organizations: No    Attends Engineer, structural: Not on file    Marital Status: Never married  Intimate Partner Violence: Not At Risk (11/01/2022)   Humiliation, Afraid, Rape, and Kick questionnaire    Fear of Current or Ex-Partner: No    Emotionally Abused: No    Physically Abused: No    Sexually Abused: No    Review of Systems  All other systems reviewed and are negative.       Objective    BP 119/76 (BP Location: Right Arm, Patient Position: Sitting, Cuff Size: Large)   Pulse 60   SpO2 97%   Physical Exam Vitals and nursing note reviewed.  Constitutional:      General: She is not in acute distress.    Appearance: She is obese.  Cardiovascular:     Rate and Rhythm: Normal rate and regular rhythm.  Pulmonary:     Effort: Pulmonary effort is normal.     Breath sounds: Normal breath sounds.  Abdominal:     Palpations: Abdomen is soft.     Tenderness: There is no abdominal tenderness.  Neurological:     General: No focal deficit present.     Mental Status: She is alert and  oriented to person, place, and time.  Assessment & Plan:   Encounter for weight management  Class 1 obesity due to excess calories with serious comorbidity and body mass index (BMI) of 34.0 to 34.9 in adult  Hyperlipidemia, unspecified hyperlipidemia type  Irritable bowel syndrome with constipation -     linaCLOtide ; Take 1 capsule (290 mcg total) by mouth daily before breakfast.  Dispense: 90 capsule; Refill: 3  Other orders -     Phentermine  HCl; Take 1 tablet (37.5 mg total) by mouth daily before breakfast.  Dispense: 30 tablet; Refill: 0 -     Rosuvastatin  Calcium ; Take 1 tablet (10 mg total) by mouth daily.  Dispense: 90 tablet; Refill: 3     No follow-ups on file.   Arlo Lama, MD

## 2023-07-07 ENCOUNTER — Other Ambulatory Visit (HOSPITAL_COMMUNITY): Payer: Self-pay

## 2023-07-07 ENCOUNTER — Ambulatory Visit (INDEPENDENT_AMBULATORY_CARE_PROVIDER_SITE_OTHER): Admitting: Family Medicine

## 2023-07-07 VITALS — BP 115/78 | HR 68 | Wt 235.6 lb

## 2023-07-07 DIAGNOSIS — Z6835 Body mass index (BMI) 35.0-35.9, adult: Secondary | ICD-10-CM

## 2023-07-07 DIAGNOSIS — Z13228 Encounter for screening for other metabolic disorders: Secondary | ICD-10-CM | POA: Diagnosis not present

## 2023-07-07 DIAGNOSIS — Z0000 Encounter for general adult medical examination without abnormal findings: Secondary | ICD-10-CM | POA: Diagnosis not present

## 2023-07-07 DIAGNOSIS — Z1329 Encounter for screening for other suspected endocrine disorder: Secondary | ICD-10-CM

## 2023-07-07 DIAGNOSIS — E66812 Obesity, class 2: Secondary | ICD-10-CM | POA: Diagnosis not present

## 2023-07-07 DIAGNOSIS — Z13220 Encounter for screening for lipoid disorders: Secondary | ICD-10-CM | POA: Diagnosis not present

## 2023-07-07 DIAGNOSIS — Z130 Encounter for screening for diseases of the blood and blood-forming organs and certain disorders involving the immune mechanism: Secondary | ICD-10-CM

## 2023-07-07 MED ORDER — PHENTERMINE HCL 37.5 MG PO TABS
37.5000 mg | ORAL_TABLET | Freq: Every day | ORAL | 0 refills | Status: AC
  Filled 2023-07-07 (×2): qty 30, 30d supply, fill #0

## 2023-07-07 NOTE — Progress Notes (Signed)
 Established Patient Office Visit  Subjective    Patient ID: [name], female    DOB: 01-24-1965  Age: 58 y.o. MRN: [id number]  CC:  Chief Complaint  Patient presents with   Annual Exam    HPI [name] presents for routine annual exam. Patient denies acute complaints.   Outpatient Encounter Medications as of 07/07/2023  Medication Sig   betamethasone , augmented, (DIPROLENE ) 0.05 % lotion Apply to scalp 3 times per week as needed for itching.   Cholecalciferol  (VITAMIN D3) 1.25 MG (50000 UT) CAPS Take 1 capsule (1.25 mg total) by mouth every 30 (thirty) days.   hydrocortisone  2.5 % cream Apply 1 Application topically 2 (two) times daily. PRN   linaclotide  (LINZESS ) 290 MCG CAPS capsule Take 1 capsule (290 mcg total) by mouth daily before breakfast.   rosuvastatin  (CRESTOR ) 10 MG tablet Take 1 tablet (10 mg total) by mouth daily.   [DISCONTINUED] phentermine  (ADIPEX-P ) 37.5 MG tablet Take 1 tablet (37.5 mg total) by mouth daily before breakfast.   phentermine  (ADIPEX-P ) 37.5 MG tablet Take 1 tablet (37.5 mg total) by mouth daily before breakfast.   No facility-administered encounter medications on file as of 07/07/2023.    Past Medical History:  Diagnosis Date   Bilateral chronic knee pain    BRCA negative    History of abnormal Pap smear    Hyperlipidemia    Vitamin D  deficiency     Past Surgical History:  Procedure Laterality Date   BREAST EXCISIONAL BIOPSY Left 2000   benign   BREAST LUMPECTOMY     bx 2/2 clogged milk duct 1.5 years after nursing daughter    CESAREAN SECTION  1996   foot surgey     01/2021   LEEP     ?2014    Family History  Problem Relation Age of Onset   Uterine cancer Mother    Diabetes Mother    Diverticulitis Mother    Cancer Mother        uterine   Hypertension Mother    Hypertension Father    Colon polyps Father    Hyperlipidemia Father    Colonic polyp Father    Arthritis Father    Diabetes Maternal  Grandmother    Diabetes Maternal Grandfather    Breast cancer Paternal Grandmother 56       deceased at 56   Cancer Paternal Grandmother        breast   Bipolar disorder Daughter    Diabetes Maternal Uncle    Breast cancer Paternal Aunt        Paternal/unsure of age   Cancer Paternal Aunt        breast   Arthritis Paternal Aunt    Breast cancer Cousin 61        3 paternal cousin   Cancer Cousin        p side breast cancer    Breast cancer Cousin 40       paternal   Cancer Cousin        paternal side breast ca   Breast cancer Cousin        paternal   Uterine cancer Cousin        died age 38 2020/03/12    Social History   Socioeconomic History   Marital status: Single    Spouse name: Not on file   Number of children: Not on file   Years of education: Not on file   Highest education level: Bachelor's  degree (e.g., BA, AB, BS)  Occupational History   Not on file  Tobacco Use   Smoking status: Never   Smokeless tobacco: Never  Vaping Use   Vaping status: Never Used  Substance and Sexual Activity   Alcohol use: No   Drug use: No   Sexual activity: Not Currently    Birth control/protection: None  Other Topics Concern   Not on file  Social History Narrative   Single    Bachelors degree    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 [employer]   Father from [place] grew up in [place]   1 daughter 21 y.o as of 07/01/17    Social Drivers of Health   Financial Resource Strain: Low Risk  (07/07/2023)   Overall Financial Resource Strain (CARDIA)    Difficulty of Paying Living Expenses: Not hard at all  Food Insecurity: No Food Insecurity (07/07/2023)   Hunger Vital Sign    Worried About Running Out of Food in the Last Year: Never true    Ran Out of Food in the Last Year: Never true  Transportation Needs: No Transportation Needs (07/07/2023)   PRAPARE - Administrator, Civil Service (Medical): No    Lack of Transportation (Non-Medical): No  Physical Activity: Sufficiently Active (07/07/2023)   Exercise  Vital Sign    Days of Exercise per Week: 4 days    Minutes of Exercise per Session: 40 min  Stress: No Stress Concern Present (07/07/2023)   [employer] of Occupational Health - Occupational Stress Questionnaire    Feeling of Stress: Not at all  Social Connections: Moderately Isolated (07/07/2023)   Social Connection and Isolation Panel    Frequency of Communication with Friends and Family: More than three times a week    Frequency of Social Gatherings with Friends and Family: Once a week    Attends Religious Services: 1 to 4 times per year    Active Member of [employer] or Organizations: No    Attends Banker Meetings: Not on file    Marital Status: Never married  Intimate Partner Violence: Not At Risk (11/01/2022)   Humiliation, Afraid, Rape, and Kick questionnaire    Fear of Current or Ex-Partner: No    Emotionally Abused: No    Physically Abused: No    Sexually Abused: No    Review of Systems  All other systems reviewed and are negative.       Objective    BP 115/78 (BP Location: Right Arm, Patient Position: Sitting, Cuff Size: Large)   Pulse 68   Wt 235 lb 9.6 oz (106.9 kg)   SpO2 97%   BMI 35.82 kg/m   Physical Exam Vitals and nursing note reviewed.  Constitutional:      General: She is not in acute distress.    Appearance: She is obese.  HENT:     Head: Normocephalic and atraumatic.     Right Ear: Tympanic membrane, ear canal and external ear normal.     Left Ear: Tympanic membrane, ear canal and external ear normal.     Nose: Nose normal.     Mouth/Throat:     Mouth: Mucous membranes are moist.     Pharynx: Oropharynx is clear.   Eyes:     Conjunctiva/sclera: Conjunctivae normal.     Pupils: Pupils are equal, round, and reactive to light.   Neck:     Thyroid : No thyromegaly.   Cardiovascular:     Rate and Rhythm: Normal rate and regular rhythm.  Heart sounds: Normal heart sounds. No murmur heard. Pulmonary:     Effort: Pulmonary  effort is normal. No respiratory distress.     Breath sounds: Normal breath sounds.  Abdominal:     General: There is no distension.     Palpations: Abdomen is soft. There is no mass.     Tenderness: There is no abdominal tenderness.   Musculoskeletal:        General: Normal range of motion.     Cervical back: Normal range of motion and neck supple.   Skin:    General: Skin is warm and dry.   Neurological:     General: No focal deficit present.     Mental Status: She is alert and oriented to person, place, and time.   Psychiatric:        Mood and Affect: Mood normal.        Behavior: Behavior normal.         Assessment & Plan:   Annual physical exam -     Comprehensive metabolic panel with GFR  Screening for deficiency anemia -     CBC with Differential/Platelet  Screening for lipid disorders -     Lipid panel  Screening for endocrine/metabolic/immunity disorders -     VITAMIN D  25 Hydroxy (Vit-D Deficiency, Fractures) -     Hemoglobin A1c -     TSH  Class 2 severe obesity due to excess calories with serious comorbidity and body mass index (BMI) of 35.0 to 35.9 in adult [facility])  Other orders -     Phentermine  HCl; Take 1 tablet (37.5 mg total) by mouth daily before breakfast.  Dispense: 30 tablet; Refill: 0     Return in about 4 weeks (around 08/04/2023) for follow up.   Tanda Raguel SQUIBB, MD

## 2023-07-08 LAB — CBC WITH DIFFERENTIAL/PLATELET
Basophils Absolute: 0 10*3/uL (ref 0.0–0.2)
Basos: 1 %
EOS (ABSOLUTE): 0.1 10*3/uL (ref 0.0–0.4)
Eos: 3 %
Hematocrit: 39.2 % (ref 34.0–46.6)
Hemoglobin: 12.5 g/dL (ref 11.1–15.9)
Immature Grans (Abs): 0 10*3/uL (ref 0.0–0.1)
Immature Granulocytes: 0 %
Lymphocytes Absolute: 1.8 10*3/uL (ref 0.7–3.1)
Lymphs: 42 %
MCH: 28.7 pg (ref 26.6–33.0)
MCHC: 31.9 g/dL (ref 31.5–35.7)
MCV: 90 fL (ref 79–97)
Monocytes Absolute: 0.3 10*3/uL (ref 0.1–0.9)
Monocytes: 6 %
Neutrophils Absolute: 2 10*3/uL (ref 1.4–7.0)
Neutrophils: 48 %
Platelets: 198 10*3/uL (ref 150–450)
RBC: 4.36 x10E6/uL (ref 3.77–5.28)
RDW: 14.5 % (ref 11.7–15.4)
WBC: 4.2 10*3/uL (ref 3.4–10.8)

## 2023-07-08 LAB — COMPREHENSIVE METABOLIC PANEL WITH GFR
ALT: 42 IU/L — ABNORMAL HIGH (ref 0–32)
AST: 32 IU/L (ref 0–40)
Albumin: 3.9 g/dL (ref 3.8–4.9)
Alkaline Phosphatase: 96 IU/L (ref 44–121)
BUN/Creatinine Ratio: 9 (ref 9–23)
BUN: 10 mg/dL (ref 6–24)
Bilirubin Total: 0.5 mg/dL (ref 0.0–1.2)
CO2: 21 mmol/L (ref 20–29)
Calcium: 9.2 mg/dL (ref 8.7–10.2)
Chloride: 104 mmol/L (ref 96–106)
Creatinine, Ser: 1.12 mg/dL — ABNORMAL HIGH (ref 0.57–1.00)
Globulin, Total: 3.1 g/dL (ref 1.5–4.5)
Glucose: 90 mg/dL (ref 70–99)
Potassium: 4.1 mmol/L (ref 3.5–5.2)
Sodium: 142 mmol/L (ref 134–144)
Total Protein: 7 g/dL (ref 6.0–8.5)
eGFR: 57 mL/min/{1.73_m2} — ABNORMAL LOW (ref >59)

## 2023-07-08 LAB — HEMOGLOBIN A1C
Est. average glucose Bld gHb Est-mCnc: 120 mg/dL
Hgb A1c MFr Bld: 5.8 % — ABNORMAL HIGH (ref 4.8–5.6)

## 2023-07-08 LAB — LIPID PANEL
Chol/HDL Ratio: 3.4 ratio (ref 0.0–4.4)
Cholesterol, Total: 163 mg/dL (ref 100–199)
HDL: 48 mg/dL (ref >39)
LDL Chol Calc (NIH): 97 mg/dL (ref 0–99)
Triglycerides: 100 mg/dL (ref 0–149)
VLDL Cholesterol Cal: 18 mg/dL (ref 5–40)

## 2023-07-08 LAB — TSH: TSH: 2.08 u[IU]/mL (ref 0.450–4.500)

## 2023-07-08 LAB — VITAMIN D 25 HYDROXY (VIT D DEFICIENCY, FRACTURES): Vit D, 25-Hydroxy: 102 ng/mL — ABNORMAL HIGH (ref 30.0–100.0)

## 2023-07-10 ENCOUNTER — Ambulatory Visit: Payer: Self-pay | Admitting: Family Medicine

## 2023-08-07 ENCOUNTER — Other Ambulatory Visit (HOSPITAL_BASED_OUTPATIENT_CLINIC_OR_DEPARTMENT_OTHER): Payer: Self-pay

## 2023-08-07 ENCOUNTER — Other Ambulatory Visit: Payer: Self-pay

## 2023-08-07 ENCOUNTER — Ambulatory Visit: Admitting: Family Medicine

## 2023-08-07 VITALS — BP 106/69 | HR 61 | Ht 68.0 in | Wt 234.8 lb

## 2023-08-07 DIAGNOSIS — Z6835 Body mass index (BMI) 35.0-35.9, adult: Secondary | ICD-10-CM

## 2023-08-07 DIAGNOSIS — E66812 Obesity, class 2: Secondary | ICD-10-CM

## 2023-08-07 DIAGNOSIS — K625 Hemorrhage of anus and rectum: Secondary | ICD-10-CM | POA: Diagnosis not present

## 2023-08-07 DIAGNOSIS — Z7689 Persons encountering health services in other specified circumstances: Secondary | ICD-10-CM

## 2023-08-07 MED ORDER — CONTRAVE 8-90 MG PO TB12
ORAL_TABLET | ORAL | 0 refills | Status: AC
  Filled 2023-08-07: qty 120, 30d supply, fill #0
  Filled 2023-08-13: qty 120, 49d supply, fill #0
  Filled 2023-08-19: qty 120, 30d supply, fill #0

## 2023-08-11 ENCOUNTER — Encounter: Payer: Self-pay | Admitting: Family Medicine

## 2023-08-11 NOTE — Progress Notes (Signed)
 Established Patient Office Visit  Subjective    Patient ID: [name], female    DOB: 08-15-1965  Age: 58 y.o. MRN: [id number]  CC:  Chief Complaint  Patient presents with   Medical Management of Chronic Issues    HPI [name] presents for routine weight management. She states that she does not believe that the meds are working as well as it should and would like to try another med. She also reports that she has rectal bleeding which she believes are hemorrhoids.   Outpatient Encounter Medications as of 08/07/2023  Medication Sig   betamethasone , augmented, (DIPROLENE ) 0.05 % lotion Apply to scalp 3 times per week as needed for itching.   Cholecalciferol  (VITAMIN D3) 1.25 MG (50000 UT) CAPS Take 1 capsule (1.25 mg total) by mouth every 30 (thirty) days.   hydrocortisone  2.5 % cream Apply 1 Application topically 2 (two) times daily. PRN   linaclotide  (LINZESS ) 290 MCG CAPS capsule Take 1 capsule (290 mcg total) by mouth daily before breakfast.   Naltrexone -buPROPion  HCl ER (CONTRAVE ) 8-90 MG TB12 Start 1 tablet every morning for 7 days, then 1 tablet twice daily for 7 days, then 2 tablets every morning and one every evening   phentermine  (ADIPEX-P ) 37.5 MG tablet Take 1 tablet (37.5 mg total) by mouth daily before breakfast.   rosuvastatin  (CRESTOR ) 10 MG tablet Take 1 tablet (10 mg total) by mouth daily.   No facility-administered encounter medications on file as of 08/07/2023.    Past Medical History:  Diagnosis Date   Bilateral chronic knee pain    BRCA negative    History of abnormal Pap smear    Hyperlipidemia    Vitamin D  deficiency     Past Surgical History:  Procedure Laterality Date   BREAST EXCISIONAL BIOPSY Left 2000   benign   BREAST LUMPECTOMY     bx 2/2 clogged milk duct 1.5 years after nursing [name]    CESAREAN SECTION  1996   foot surgey     01/2021   LEEP     ?2014    Family History  Problem Relation Age of Onset   Uterine  cancer Mother    Diabetes Mother    Diverticulitis Mother    Cancer Mother        uterine   Hypertension Mother    Hypertension Father    Colon polyps Father    Hyperlipidemia Father    Colonic polyp Father    Arthritis Father    Diabetes Maternal Grandmother    Diabetes Maternal Grandfather    Breast cancer Paternal Grandmother 50       deceased at 42   Cancer Paternal Grandmother        breast   Bipolar disorder [name]    Diabetes Maternal Uncle    Breast cancer Paternal Aunt        Paternal/unsure of age   Cancer Paternal Aunt        breast   Arthritis Paternal Aunt    Breast cancer Cousin 71        3 paternal cousin   Cancer Cousin        p side breast cancer    Breast cancer Cousin 40       paternal   Cancer Cousin        paternal side breast ca   Breast cancer Cousin        paternal   Uterine cancer Cousin  died age 48 02/2020    Social History   Socioeconomic History   Marital status: Single    Spouse name: Not on file   Number of children: Not on file   Years of education: Not on file   Highest education level: Bachelor's degree (e.g., BA, AB, BS)  Occupational History   Not on file  Tobacco Use   Smoking status: Never   Smokeless tobacco: Never  Vaping Use   Vaping status: Never Used  Substance and Sexual Activity   Alcohol use: No   Drug use: No   Sexual activity: Not Currently    Birth control/protection: None  Other Topics Concern   Not on file  Social History Narrative   Single    Bachelors degree    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 [employer]   Father from [place] grew up in [place]   1 [name] 58 y.o as of 07/01/17    Social Drivers of Health   Financial Resource Strain: Low Risk  (07/07/2023)   Overall Financial Resource Strain (CARDIA)    Difficulty of Paying Living Expenses: Not hard at all  Food Insecurity: No Food Insecurity (07/07/2023)   Hunger Vital Sign    Worried About Running Out of Food in the Last Year: Never true    Ran Out of Food in the  Last Year: Never true  Transportation Needs: No Transportation Needs (07/07/2023)   PRAPARE - Administrator, Civil Service (Medical): No    Lack of Transportation (Non-Medical): No  Physical Activity: Sufficiently Active (07/07/2023)   Exercise Vital Sign    Days of Exercise per Week: 4 days    Minutes of Exercise per Session: 40 min  Stress: No Stress Concern Present (07/07/2023)   [employer] of Occupational Health - Occupational Stress Questionnaire    Feeling of Stress: Not at all  Social Connections: Moderately Isolated (07/07/2023)   Social Connection and Isolation Panel    Frequency of Communication with Friends and Family: More than three times a week    Frequency of Social Gatherings with Friends and Family: Once a week    Attends Religious Services: 1 to 4 times per year    Active Member of [employer] or Organizations: No    Attends Banker Meetings: Not on file    Marital Status: Never married  Intimate Partner Violence: Not At Risk (11/01/2022)   Humiliation, Afraid, Rape, and Kick questionnaire    Fear of Current or Ex-Partner: No    Emotionally Abused: No    Physically Abused: No    Sexually Abused: No    Review of Systems  All other systems reviewed and are negative.       Objective    BP 106/69   Pulse 61   Ht 5' 8 (1.727 m)   Wt 234 lb 12.8 oz (106.5 kg)   SpO2 98%   BMI 35.70 kg/m   Physical Exam Vitals and nursing note reviewed.  Constitutional:      General: She is not in acute distress.    Appearance: She is obese.  Cardiovascular:     Rate and Rhythm: Normal rate and regular rhythm.  Pulmonary:     Effort: Pulmonary effort is normal.     Breath sounds: Normal breath sounds.  Abdominal:     Palpations: Abdomen is soft.     Tenderness: There is no abdominal tenderness.  Neurological:     General: No focal deficit present.     Mental Status: She  is alert and oriented to person, place, and time.          Assessment & Plan:   Encounter for weight management  Class 2 severe obesity due to excess calories with serious comorbidity and body mass index (BMI) of 35.0 to 35.9 in adult [facility])  Rectal bleeding -     Ambulatory referral to Gastroenterology  Other orders -     Contrave ; Start 1 tablet every morning for 7 days, then 1 tablet twice daily for 7 days, then 2 tablets every morning and one every evening  Dispense: 120 tablet; Refill: 0     Return in about 4 weeks (around 09/04/2023) for follow up, weight management.   Tanda Raguel SQUIBB, MD

## 2023-08-12 ENCOUNTER — Telehealth (HOSPITAL_COMMUNITY): Payer: Self-pay

## 2023-08-12 ENCOUNTER — Telehealth: Payer: Self-pay

## 2023-08-12 ENCOUNTER — Other Ambulatory Visit (HOSPITAL_COMMUNITY): Payer: Self-pay

## 2023-08-12 NOTE — Telephone Encounter (Signed)
 Patient stated pharmacy sent over prior authorization for Contrave . Could you check on this please    Copied from CRM [id number]. Topic: Clinical - Medication Prior Auth >> Aug 12, 2023  9:16 AM Wess RAMAN wrote: Reason for CRM: Patient stated pharmacy sent over prior authorization for Contrave   Callback [phone]  Preferred Pharmacy: [facility] [address] [place] [address] Phone: [phone] Fax: [phone] Hours: Mon-Fri 7:30am-7pm; Sat 8:00am-4:30pm

## 2023-08-12 NOTE — Telephone Encounter (Signed)
 Pharmacy Patient Advocate Encounter   Received notification from Patient Pharmacy that prior authorization for Contrave  8-90MG  er tablets  is required/requested.   Insurance verification completed.   The patient is insured through [facility] .   Per test claim: PA required; PA submitted to above mentioned insurance via CoverMyMeds Key/confirmation #/EOC [id number] Status is pending

## 2023-08-13 ENCOUNTER — Other Ambulatory Visit (HOSPITAL_COMMUNITY): Payer: Self-pay

## 2023-08-13 ENCOUNTER — Telehealth (HOSPITAL_COMMUNITY): Payer: Self-pay | Admitting: Pharmacy Technician

## 2023-08-13 ENCOUNTER — Other Ambulatory Visit: Payer: Self-pay

## 2023-08-13 ENCOUNTER — Encounter (HOSPITAL_COMMUNITY): Payer: Self-pay

## 2023-08-13 ENCOUNTER — Encounter: Payer: Self-pay | Admitting: Family Medicine

## 2023-08-15 ENCOUNTER — Other Ambulatory Visit: Payer: Self-pay

## 2023-08-15 NOTE — Telephone Encounter (Signed)
 Pharmacy Patient Advocate Encounter  Received notification from [employer] that Prior Authorization for Contrave  8-90MG  er tablets  has been DENIED.  Full denial letter will be uploaded to the media tab. See denial reason below.   PA #/Case ID/Reference #:  [id number]

## 2023-08-19 ENCOUNTER — Other Ambulatory Visit (HOSPITAL_COMMUNITY): Payer: Self-pay

## 2023-08-19 ENCOUNTER — Other Ambulatory Visit: Payer: Self-pay

## 2023-09-03 ENCOUNTER — Other Ambulatory Visit (HOSPITAL_COMMUNITY): Payer: Self-pay

## 2023-09-15 ENCOUNTER — Other Ambulatory Visit: Payer: Self-pay

## 2023-09-16 ENCOUNTER — Ambulatory Visit: Admitting: Family Medicine

## 2023-10-09 ENCOUNTER — Other Ambulatory Visit: Payer: Self-pay | Admitting: Family Medicine

## 2023-10-09 DIAGNOSIS — K581 Irritable bowel syndrome with constipation: Secondary | ICD-10-CM | POA: Diagnosis not present

## 2023-10-09 DIAGNOSIS — K625 Hemorrhage of anus and rectum: Secondary | ICD-10-CM | POA: Diagnosis not present

## 2023-10-09 DIAGNOSIS — Z1211 Encounter for screening for malignant neoplasm of colon: Secondary | ICD-10-CM | POA: Diagnosis not present

## 2023-10-09 DIAGNOSIS — Z1231 Encounter for screening mammogram for malignant neoplasm of breast: Secondary | ICD-10-CM

## 2023-10-20 ENCOUNTER — Ambulatory Visit
Admission: RE | Admit: 2023-10-20 | Discharge: 2023-10-20 | Disposition: A | Discharge status: Home / Self Care | Source: Ambulatory Visit | Attending: Family Medicine | Admitting: Family Medicine

## 2023-10-20 DIAGNOSIS — Z1231 Encounter for screening mammogram for malignant neoplasm of breast: Secondary | ICD-10-CM

## 2023-10-24 ENCOUNTER — Ambulatory Visit: Payer: Self-pay | Admitting: Family Medicine

## 2023-11-10 ENCOUNTER — Other Ambulatory Visit: Payer: Self-pay

## 2023-11-10 ENCOUNTER — Other Ambulatory Visit (HOSPITAL_COMMUNITY): Payer: Self-pay

## 2023-11-10 MED ORDER — GOLYTELY 236 G PO SOLR
ORAL | 0 refills | Status: DC
  Filled 2023-11-10: qty 4000, 1d supply, fill #0

## 2023-11-21 ENCOUNTER — Other Ambulatory Visit: Payer: Self-pay

## 2023-11-21 ENCOUNTER — Other Ambulatory Visit (HOSPITAL_COMMUNITY): Payer: Self-pay

## 2023-11-21 MED ORDER — GOLYTELY 236 G PO SOLR
ORAL | 0 refills | Status: AC
  Filled 2023-11-21: qty 4000, 1d supply, fill #0

## 2023-11-27 ENCOUNTER — Other Ambulatory Visit (HOSPITAL_COMMUNITY): Payer: Self-pay

## 2023-12-03 DIAGNOSIS — D123 Benign neoplasm of transverse colon: Secondary | ICD-10-CM | POA: Diagnosis not present

## 2023-12-03 DIAGNOSIS — Z1211 Encounter for screening for malignant neoplasm of colon: Secondary | ICD-10-CM | POA: Diagnosis not present

## 2023-12-03 DIAGNOSIS — Q438 Other specified congenital malformations of intestine: Secondary | ICD-10-CM | POA: Diagnosis not present

## 2023-12-03 DIAGNOSIS — K635 Polyp of colon: Secondary | ICD-10-CM | POA: Diagnosis not present

## 2023-12-03 DIAGNOSIS — K625 Hemorrhage of anus and rectum: Secondary | ICD-10-CM | POA: Diagnosis not present

## 2023-12-03 DIAGNOSIS — K648 Other hemorrhoids: Secondary | ICD-10-CM | POA: Diagnosis not present

## 2024-07-06 ENCOUNTER — Encounter: Admitting: Family Medicine
